# Patient Record
Sex: Female | Born: 1975 | Race: White | Hispanic: No | Marital: Single | Smoking: Current every day smoker
Health system: Southern US, Community
[De-identification: ages and names within clinical notes are randomized; demographics above are authoritative.]

## PROBLEM LIST (undated history)

## (undated) DIAGNOSIS — F17200 Nicotine dependence, unspecified, uncomplicated: Secondary | ICD-10-CM

## (undated) DIAGNOSIS — J45909 Unspecified asthma, uncomplicated: Secondary | ICD-10-CM

## (undated) DIAGNOSIS — F1290 Cannabis use, unspecified, uncomplicated: Secondary | ICD-10-CM

## (undated) DIAGNOSIS — B009 Herpesviral infection, unspecified: Secondary | ICD-10-CM

## (undated) DIAGNOSIS — O09519 Supervision of elderly primigravida, unspecified trimester: Secondary | ICD-10-CM

## (undated) HISTORY — DX: Nicotine dependence, unspecified, uncomplicated: F17.200

## (undated) HISTORY — DX: Unspecified asthma, uncomplicated: J45.909

## (undated) HISTORY — DX: Supervision of elderly primigravida, unspecified trimester: O09.519

## (undated) HISTORY — PX: OTHER SURGICAL HISTORY: SHX169

---

## 2014-02-01 ENCOUNTER — Other Ambulatory Visit (INDEPENDENT_AMBULATORY_CARE_PROVIDER_SITE_OTHER): Payer: Medicaid Other

## 2014-02-01 ENCOUNTER — Other Ambulatory Visit: Payer: Self-pay | Admitting: Obstetrics and Gynecology

## 2014-02-01 DIAGNOSIS — O3680X0 Pregnancy with inconclusive fetal viability, not applicable or unspecified: Secondary | ICD-10-CM

## 2014-02-01 DIAGNOSIS — O09291 Supervision of pregnancy with other poor reproductive or obstetric history, first trimester: Secondary | ICD-10-CM

## 2014-02-01 DIAGNOSIS — O09521 Supervision of elderly multigravida, first trimester: Secondary | ICD-10-CM

## 2014-02-01 NOTE — Progress Notes (Signed)
U/S-single IUP with +FCA noted, FHR- 118 bpm, CRL c/w 6+3wks EDD 09/24/2014, cx appears closed, bilateral adnexa appears WNL

## 2014-02-09 ENCOUNTER — Other Ambulatory Visit (HOSPITAL_COMMUNITY)
Admission: RE | Admit: 2014-02-09 | Discharge: 2014-02-09 | Disposition: A | Discharge status: Home / Self Care | Payer: Medicaid Other | Source: Ambulatory Visit | Attending: Adult Health | Admitting: Adult Health

## 2014-02-09 ENCOUNTER — Encounter: Payer: Self-pay | Admitting: Adult Health

## 2014-02-09 ENCOUNTER — Ambulatory Visit (INDEPENDENT_AMBULATORY_CARE_PROVIDER_SITE_OTHER): Payer: Medicaid Other | Admitting: Adult Health

## 2014-02-09 VITALS — BP 108/60 | Ht 65.0 in | Wt 137.0 lb

## 2014-02-09 DIAGNOSIS — Z124 Encounter for screening for malignant neoplasm of cervix: Secondary | ICD-10-CM

## 2014-02-09 DIAGNOSIS — Z1151 Encounter for screening for human papillomavirus (HPV): Secondary | ICD-10-CM | POA: Insufficient documentation

## 2014-02-09 DIAGNOSIS — Z23 Encounter for immunization: Secondary | ICD-10-CM

## 2014-02-09 DIAGNOSIS — Z114 Encounter for screening for human immunodeficiency virus [HIV]: Secondary | ICD-10-CM

## 2014-02-09 DIAGNOSIS — Z0283 Encounter for blood-alcohol and blood-drug test: Secondary | ICD-10-CM

## 2014-02-09 DIAGNOSIS — J45909 Unspecified asthma, uncomplicated: Secondary | ICD-10-CM | POA: Insufficient documentation

## 2014-02-09 DIAGNOSIS — F17200 Nicotine dependence, unspecified, uncomplicated: Secondary | ICD-10-CM

## 2014-02-09 DIAGNOSIS — Z0184 Encounter for antibody response examination: Secondary | ICD-10-CM

## 2014-02-09 DIAGNOSIS — Z3481 Encounter for supervision of other normal pregnancy, first trimester: Secondary | ICD-10-CM

## 2014-02-09 DIAGNOSIS — O09519 Supervision of elderly primigravida, unspecified trimester: Secondary | ICD-10-CM

## 2014-02-09 DIAGNOSIS — Z1329 Encounter for screening for other suspected endocrine disorder: Secondary | ICD-10-CM

## 2014-02-09 DIAGNOSIS — O09511 Supervision of elderly primigravida, first trimester: Secondary | ICD-10-CM

## 2014-02-09 DIAGNOSIS — Z01419 Encounter for gynecological examination (general) (routine) without abnormal findings: Secondary | ICD-10-CM | POA: Diagnosis present

## 2014-02-09 DIAGNOSIS — Z3491 Encounter for supervision of normal pregnancy, unspecified, first trimester: Secondary | ICD-10-CM

## 2014-02-09 DIAGNOSIS — IMO0001 Reserved for inherently not codable concepts without codable children: Secondary | ICD-10-CM

## 2014-02-09 DIAGNOSIS — Z331 Pregnant state, incidental: Secondary | ICD-10-CM

## 2014-02-09 DIAGNOSIS — Z113 Encounter for screening for infections with a predominantly sexual mode of transmission: Secondary | ICD-10-CM | POA: Insufficient documentation

## 2014-02-09 DIAGNOSIS — Z3490 Encounter for supervision of normal pregnancy, unspecified, unspecified trimester: Secondary | ICD-10-CM | POA: Insufficient documentation

## 2014-02-09 DIAGNOSIS — Z1389 Encounter for screening for other disorder: Secondary | ICD-10-CM

## 2014-02-09 HISTORY — DX: Reserved for inherently not codable concepts without codable children: IMO0001

## 2014-02-09 HISTORY — DX: Supervision of elderly primigravida, unspecified trimester: O09.519

## 2014-02-09 HISTORY — DX: Nicotine dependence, unspecified, uncomplicated: F17.200

## 2014-02-09 HISTORY — DX: Unspecified asthma, uncomplicated: J45.909

## 2014-02-09 LAB — POCT URINALYSIS DIPSTICK
Blood, UA: NEGATIVE
Glucose, UA: NEGATIVE
Ketones, UA: NEGATIVE
Nitrite, UA: NEGATIVE
PROTEIN UA: NEGATIVE

## 2014-02-09 LAB — CBC
HEMATOCRIT: 41.5 % (ref 36.0–46.0)
Hemoglobin: 14.2 g/dL (ref 12.0–15.0)
MCH: 30.3 pg (ref 26.0–34.0)
MCHC: 34.2 g/dL (ref 30.0–36.0)
MCV: 88.7 fL (ref 78.0–100.0)
PLATELETS: 177 10*3/uL (ref 150–400)
RBC: 4.68 MIL/uL (ref 3.87–5.11)
RDW: 13.7 % (ref 11.5–15.5)
WBC: 5.4 10*3/uL (ref 4.0–10.5)

## 2014-02-09 LAB — TSH: TSH: 1.177 u[IU]/mL (ref 0.350–4.500)

## 2014-02-09 MED ORDER — PRENATAL PLUS 27-1 MG PO TABS: 1.0000 | ORAL_TABLET | Freq: Every day | ORAL | Status: AC

## 2014-02-09 NOTE — Patient Instructions (Signed)
Smoking Cessation Quitting smoking is important to your health and has many advantages. However, it is not always easy to quit since nicotine is a very addictive drug. Oftentimes, people try 3 times or more before being able to quit. This document explains the best ways for you to prepare to quit smoking. Quitting takes hard work and a lot of effort, but you can do it. ADVANTAGES OF QUITTING SMOKING  You will live longer, feel better, and live better.  Your body will feel the impact of quitting smoking almost immediately.  Within 20 minutes, blood pressure decreases. Your pulse returns to its normal level.  After 8 hours, carbon monoxide levels in the blood return to normal. Your oxygen level increases.  After 24 hours, the chance of having a heart attack starts to decrease. Your breath, hair, and body stop smelling like smoke.  After 48 hours, damaged nerve endings begin to recover. Your sense of taste and smell improve.  After 72 hours, the body is virtually free of nicotine. Your bronchial tubes relax and breathing becomes easier.  After 2 to 12 weeks, lungs can hold more air. Exercise becomes easier and circulation improves.  The risk of having a heart attack, stroke, cancer, or lung disease is greatly reduced.  After 1 year, the risk of coronary heart disease is cut in half.  After 5 years, the risk of stroke falls to the same as a nonsmoker.  After 10 years, the risk of lung cancer is cut in half and the risk of other cancers decreases significantly.  After 15 years, the risk of coronary heart disease drops, usually to the level of a nonsmoker.  If you are pregnant, quitting smoking will improve your chances of having a healthy baby.  The people you live with, especially any children, will be healthier.  You will have extra money to spend on things other than cigarettes. QUESTIONS TO THINK ABOUT BEFORE ATTEMPTING TO QUIT You may want to talk about your answers with your  health care provider.  Why do you want to quit?  If you tried to quit in the past, what helped and what did not?  What will be the most difficult situations for you after you quit? How will you plan to handle them?  Who can help you through the tough times? Your family? Friends? A health care provider?  What pleasures do you get from smoking? What ways can you still get pleasure if you quit? Here are some questions to ask your health care provider:  How can you help me to be successful at quitting?  What medicine do you think would be best for me and how should I take it?  What should I do if I need more help?  What is smoking withdrawal like? How can I get information on withdrawal? GET READY  Set a quit date.  Change your environment by getting rid of all cigarettes, ashtrays, matches, and lighters in your home, car, or work. Do not let people smoke in your home.  Review your past attempts to quit. Think about what worked and what did not. GET SUPPORT AND ENCOURAGEMENT You have a better chance of being successful if you have help. You can get support in many ways.  Tell your family, friends, and coworkers that you are going to quit and need their support. Ask them not to smoke around you.  Get individual, group, or telephone counseling and support. Programs are available at local hospitals and health centers. Call   your local health department for information about programs in your area.  Spiritual beliefs and practices may help some smokers quit.  Download a "quit meter" on your computer to keep track of quit statistics, such as how long you have gone without smoking, cigarettes not smoked, and money saved.  Get a self-help book about quitting smoking and staying off tobacco. LEARN NEW SKILLS AND BEHAVIORS  Distract yourself from urges to smoke. Talk to someone, go for a walk, or occupy your time with a task.  Change your normal routine. Take a different route to work.  Drink tea instead of coffee. Eat breakfast in a different place.  Reduce your stress. Take a hot bath, exercise, or read a book.  Plan something enjoyable to do every day. Reward yourself for not smoking.  Explore interactive web-based programs that specialize in helping you quit. GET MEDICINE AND USE IT CORRECTLY Medicines can help you stop smoking and decrease the urge to smoke. Combining medicine with the above behavioral methods and support can greatly increase your chances of successfully quitting smoking.  Nicotine replacement therapy helps deliver nicotine to your body without the negative effects and risks of smoking. Nicotine replacement therapy includes nicotine gum, lozenges, inhalers, nasal sprays, and skin patches. Some may be available over-the-counter and others require a prescription.  Antidepressant medicine helps people abstain from smoking, but how this works is unknown. This medicine is available by prescription.  Nicotinic receptor partial agonist medicine simulates the effect of nicotine in your brain. This medicine is available by prescription. Ask your health care provider for advice about which medicines to use and how to use them based on your health history. Your health care provider will tell you what side effects to look out for if you choose to be on a medicine or therapy. Carefully read the information on the package. Do not use any other product containing nicotine while using a nicotine replacement product.  RELAPSE OR DIFFICULT SITUATIONS Most relapses occur within the first 3 months after quitting. Do not be discouraged if you start smoking again. Remember, most people try several times before finally quitting. You may have symptoms of withdrawal because your body is used to nicotine. You may crave cigarettes, be irritable, feel very hungry, cough often, get headaches, or have difficulty concentrating. The withdrawal symptoms are only temporary. They are strongest  when you first quit, but they will go away within 10-14 days. To reduce the chances of relapse, try to:  Avoid drinking alcohol. Drinking lowers your chances of successfully quitting.  Reduce the amount of caffeine you consume. Once you quit smoking, the amount of caffeine in your body increases and can give you symptoms, such as a rapid heartbeat, sweating, and anxiety.  Avoid smokers because they can make you want to smoke.  Do not let weight gain distract you. Many smokers will gain weight when they quit, usually less than 10 pounds. Eat a healthy diet and stay active. You can always lose the weight gained after you quit.  Find ways to improve your mood other than smoking. FOR MORE INFORMATION  www.smokefree.gov  Document Released: 04/03/2001 Document Revised: 08/24/2013 Document Reviewed: 07/19/2011 [facility] Patient Information 2015 [place], [place]. This information is not intended to replace advice given to you by your health care provider. Make sure you discuss any questions you have with your health care provider. First Trimester of Pregnancy The first trimester of pregnancy is from week 1 until the end of week 12 (months 1 through  3). A week after a sperm fertilizes an egg, the egg will implant on the wall of the uterus. This embryo will begin to develop into a baby. Genes from you and your partner are forming the baby. The female genes determine whether the baby is a boy or a girl. At 6-8 weeks, the eyes and face are formed, and the heartbeat can be seen on ultrasound. At the end of 12 weeks, all the baby's organs are formed.  Now that you are pregnant, you will want to do everything you can to have a healthy baby. Two of the most important things are to get good prenatal care and to follow your health care provider's instructions. Prenatal care is all the medical care you receive before the baby's birth. This care will help prevent, find, and treat any problems during the pregnancy and  childbirth. BODY CHANGES Your body goes through many changes during pregnancy. The changes vary from woman to woman.   You may gain or lose a couple of pounds at first.  You may feel sick to your stomach (nauseous) and throw up (vomit). If the vomiting is uncontrollable, call your health care provider.  You may tire easily.  You may develop headaches that can be relieved by medicines approved by your health care provider.  You may urinate more often. Painful urination may mean you have a bladder infection.  You may develop heartburn as a result of your pregnancy.  You may develop constipation because certain hormones are causing the muscles that push waste through your intestines to slow down.  You may develop hemorrhoids or swollen, bulging veins (varicose veins).  Your breasts may begin to grow larger and become tender. Your nipples may stick out more, and the tissue that surrounds them (areola) may become darker.  Your gums may bleed and may be sensitive to brushing and flossing.  Dark spots or blotches (chloasma, mask of pregnancy) may develop on your face. This will likely fade after the baby is born.  Your menstrual periods will stop.  You may have a loss of appetite.  You may develop cravings for certain kinds of food.  You may have changes in your emotions from day to day, such as being excited to be pregnant or being concerned that something may go wrong with the pregnancy and baby.  You may have more vivid and strange dreams.  You may have changes in your hair. These can include thickening of your hair, rapid growth, and changes in texture. Some women also have hair loss during or after pregnancy, or hair that feels dry or thin. Your hair will most likely return to normal after your baby is born. WHAT TO EXPECT AT YOUR PRENATAL VISITS During a routine prenatal visit:  You will be weighed to make sure you and the baby are growing normally.  Your blood pressure will  be taken.  Your abdomen will be measured to track your baby's growth.  The fetal heartbeat will be listened to starting around week 10 or 12 of your pregnancy.  Test results from any previous visits will be discussed. Your health care provider may ask you:  How you are feeling.  If you are feeling the baby move.  If you have had any abnormal symptoms, such as leaking fluid, bleeding, severe headaches, or abdominal cramping.  If you have any questions. Other tests that may be performed during your first trimester include:  Blood tests to find your blood type and to check for  the presence of any previous infections. They will also be used to check for low iron levels (anemia) and Rh antibodies. Later in the pregnancy, blood tests for diabetes will be done along with other tests if problems develop.  Urine tests to check for infections, diabetes, or protein in the urine.  An ultrasound to confirm the proper growth and development of the baby.  An amniocentesis to check for possible genetic problems.  Fetal screens for spina bifida and Down syndrome.  You may need other tests to make sure you and the baby are doing well. HOME CARE INSTRUCTIONS  Medicines  Follow your health care provider's instructions regarding medicine use. Specific medicines may be either safe or unsafe to take during pregnancy.  Take your prenatal vitamins as directed.  If you develop constipation, try taking a stool softener if your health care provider approves. Diet  Eat regular, well-balanced meals. Choose a variety of foods, such as meat or vegetable-based protein, fish, milk and low-fat dairy products, vegetables, fruits, and whole grain breads and cereals. Your health care provider will help you determine the amount of weight gain that is right for you.  Avoid raw meat and uncooked cheese. These carry germs that can cause birth defects in the baby.  Eating four or five small meals rather than three  large meals a day may help relieve nausea and vomiting. If you start to feel nauseous, eating a few soda crackers can be helpful. Drinking liquids between meals instead of during meals also seems to help nausea and vomiting.  If you develop constipation, eat more high-fiber foods, such as fresh vegetables or fruit and whole grains. Drink enough fluids to keep your urine clear or pale yellow. Activity and Exercise  Exercise only as directed by your health care provider. Exercising will help you:  Control your weight.  Stay in shape.  Be prepared for labor and delivery.  Experiencing pain or cramping in the lower abdomen or low back is a good sign that you should stop exercising. Check with your health care provider before continuing normal exercises.  Try to avoid standing for long periods of time. Move your legs often if you must stand in one place for a long time.  Avoid heavy lifting.  Wear low-heeled shoes, and practice good posture.  You may continue to have sex unless your health care provider directs you otherwise. Relief of Pain or Discomfort  Wear a good support bra for breast tenderness.   Take warm sitz baths to soothe any pain or discomfort caused by hemorrhoids. Use hemorrhoid cream if your health care provider approves.   Rest with your legs elevated if you have leg cramps or low back pain.  If you develop varicose veins in your legs, wear support hose. Elevate your feet for 15 minutes, 3-4 times a day. Limit salt in your diet. Prenatal Care  Schedule your prenatal visits by the twelfth week of pregnancy. They are usually scheduled monthly at first, then more often in the last 2 months before delivery.  Write down your questions. Take them to your prenatal visits.  Keep all your prenatal visits as directed by your health care provider. Safety  Wear your seat belt at all times when driving.  Make a list of emergency phone numbers, including numbers for family,  friends, the hospital, and police and fire departments. General Tips  Ask your health care provider for a referral to a local prenatal education class. Begin classes no later than at  the beginning of month 6 of your pregnancy.  Ask for help if you have counseling or nutritional needs during pregnancy. Your health care provider can offer advice or refer you to specialists for help with various needs.  Do not use hot tubs, steam rooms, or saunas.  Do not douche or use tampons or scented sanitary pads.  Do not cross your legs for long periods of time.  Avoid cat litter boxes and soil used by cats. These carry germs that can cause birth defects in the baby and possibly loss of the fetus by miscarriage or stillbirth.  Avoid all smoking, herbs, alcohol, and medicines not prescribed by your health care provider. Chemicals in these affect the formation and growth of the baby.  Schedule a dentist appointment. At home, brush your teeth with a soft toothbrush and be gentle when you floss. SEEK MEDICAL CARE IF:   You have dizziness.  You have mild pelvic cramps, pelvic pressure, or nagging pain in the abdominal area.  You have persistent nausea, vomiting, or diarrhea.  You have a bad smelling vaginal discharge.  You have pain with urination.  You notice increased swelling in your face, hands, legs, or ankles. SEEK IMMEDIATE MEDICAL CARE IF:   You have a fever.  You are leaking fluid from your vagina.  You have spotting or bleeding from your vagina.  You have severe abdominal cramping or pain.  You have rapid weight gain or loss.  You vomit blood or material that looks like coffee grounds.  You are exposed to [place] measles and have never had them.  You are exposed to fifth disease or chickenpox.  You develop a severe headache.  You have shortness of breath.  You have any kind of trauma, such as from a fall or a car accident. Document Released: 04/03/2001 Document Revised:  08/24/2013 Document Reviewed: 02/17/2013 [facility] Patient Information 2015 [place], [place]. This information is not intended to replace advice given to you by your health care provider. Make sure you discuss any questions you have with your health care provider. Return in 4 weeks for IT/NT and see Selena BattenKim

## 2014-02-09 NOTE — Progress Notes (Signed)
Subjective:  Brittney MilesRebecca Allen is a 38 y.o. [id number] Caucasian female at 886w4d by US being seen today for her first obstetrical visit.  Her obstetrical history is significant for smoker and AMA.  Pregnancy history fully reviewed.Has lost 70 lbs in last year.  Patient reports nausea. Denies vb, cramping, uti s/s, abnormal/malodorous vag d/c, or vulvovaginal itching/irritation.Has history of bluish green discharge from both nipples per pt for several years.Did not do exam today.  BP 108/60  Wt 137 lb (62.143 kg)  LMP 12/04/2013  HISTORY: OB History  Gravida Para Term Preterm AB SAB TAB Ectopic Multiple Living  5 3 0 0 1 1 0 0 0 3     # Outcome Date GA Lbr Len/2nd Weight Sex Delivery Anes PTL Lv  5 CUR           4 PAR 06/14/02    M SVD   Y  3 PAR 11/13/00    F SVD   Y  2 PAR 08/23/98    F SVD   Y  1 SAB              Past Medical History  Diagnosis Date  . Asthma   . AMA (advanced maternal age) primigravida 35+ 02/09/2014   Past Surgical History  Procedure Laterality Date  . Other surgical history      right arm   Family History  Problem Relation Age of Onset  . Diabetes Mother   . Cirrhosis Mother   . Heart disease Mother   . Other Mother     collapsed lung  . Diabetes Father   . Heart disease Father     Exam   System:     General: Well developed & nourished, no acute distress   Skin: Warm & dry, normal coloration and turgor, no rashes   Neurologic: Alert & oriented, normal mood   Cardiovascular: Regular rate & rhythm   Respiratory: Effort & rate normal, LCTAB, acyanotic   Abdomen: Soft, non tender   Extremities: normal strength, tone                            Thyroid normal, has brownish area on tongue feels like burned will watch Pelvic Exam:    Perineum: Normal perineum   Vulva: Normal, no lesions   Vagina:  Normal mucosa, normal discharge   Cervix: Normal, bulbous, appears closed, everted at OS   Uterus: Normal size/shape/contour for GA   Thin prep pap smear  with high risk HPV cotesting and GC/CHL performed FHR: 154 via US   Assessment:   Pregnancy: [id number] Patient Active Problem List   Diagnosis Date Noted  . Supervision of normal pregnancy in first trimester 02/09/2014  . AMA (advanced maternal age) primigravida 35+ 02/09/2014    606w4d [id number] New OB visit     Plan:  Initial labs drawn Continue prenatal vitamins Problem list reviewed and updated Reviewed n/v relief measures and warning s/s to report Reviewed recommended weight gain based on pre-gravid BMI Encouraged well-balanced diet Genetic Screening discussed Integrated Screen: requested Cystic fibrosis screening discussed declined Ultrasound discussed; fetal survey: requested Follow up in 4 weeks for IT/NT and see Selena BattenKim  Got flu shot today Counseled to stop smoking will refer to [facility] [place] [facility] while in hospital  Adline PotterJennifer A. Griffin, NP 02/09/2014 11:47 AM

## 2014-02-10 LAB — RPR

## 2014-02-10 LAB — DRUG SCREEN, URINE, NO CONFIRMATION
Amphetamine Screen, Ur: NEGATIVE
Barbiturate Quant, Ur: NEGATIVE
Benzodiazepines.: NEGATIVE
Cocaine Metabolites: NEGATIVE
Creatinine,U: 52.6 mg/dL
MARIJUANA METABOLITE: POSITIVE — AB
METHADONE: NEGATIVE
OPIATE SCREEN, URINE: NEGATIVE
PHENCYCLIDINE (PCP): NEGATIVE
Propoxyphene: NEGATIVE

## 2014-02-10 LAB — URINALYSIS
Bilirubin Urine: NEGATIVE
Glucose, UA: NEGATIVE mg/dL
HGB URINE DIPSTICK: NEGATIVE
Ketones, ur: NEGATIVE mg/dL
NITRITE: NEGATIVE
PH: 7.5 (ref 5.0–8.0)
PROTEIN: NEGATIVE mg/dL
Specific Gravity, Urine: 1.011 (ref 1.005–1.030)
Urobilinogen, UA: 0.2 mg/dL (ref 0.0–1.0)

## 2014-02-10 LAB — OXYCODONE SCREEN, UA, RFLX CONFIRM: Oxycodone Screen, Ur: NEGATIVE ng/mL

## 2014-02-10 LAB — HEPATITIS B SURFACE ANTIGEN: Hepatitis B Surface Ag: NEGATIVE

## 2014-02-10 LAB — ABO AND RH: Rh Type: POSITIVE

## 2014-02-10 LAB — HIV ANTIBODY (ROUTINE TESTING W REFLEX): HIV: NONREACTIVE

## 2014-02-10 LAB — CYTOLOGY - PAP

## 2014-02-10 LAB — VARICELLA ZOSTER ANTIBODY, IGG: Varicella IgG: 651.1 Index — ABNORMAL HIGH (ref <135.00)

## 2014-02-10 LAB — RUBELLA SCREEN: RUBELLA: 6.64 {index} — AB (ref <0.90)

## 2014-02-10 LAB — ANTIBODY SCREEN: Antibody Screen: NEGATIVE

## 2014-02-11 LAB — URINE CULTURE
COLONY COUNT: NO GROWTH
Organism ID, Bacteria: NO GROWTH

## 2014-02-23 ENCOUNTER — Encounter: Payer: Self-pay | Admitting: Adult Health

## 2014-03-02 ENCOUNTER — Telehealth: Payer: Self-pay | Admitting: *Deleted

## 2014-03-02 NOTE — Telephone Encounter (Signed)
Pt states she is [redacted] wks pregnant and has terrible cough and chest congestion, our records have her at 10+4.  I advised her to try saline nasal spray, Vicks Vapo Rub, steam therapy, push fluids and hot decaffeinated tea for the congestion and cough drops for the cough.  Informed her that she could take Robitussin (alcohol free) if really needs it and if no improvement or if symptoms worsen to call back and we will get her in to be seen.  Pt verbalized understanding.

## 2014-03-09 ENCOUNTER — Ambulatory Visit (INDEPENDENT_AMBULATORY_CARE_PROVIDER_SITE_OTHER): Payer: Medicaid Other | Admitting: Women's Health

## 2014-03-09 ENCOUNTER — Ambulatory Visit (INDEPENDENT_AMBULATORY_CARE_PROVIDER_SITE_OTHER): Payer: Medicaid Other

## 2014-03-09 ENCOUNTER — Encounter: Payer: Self-pay | Admitting: Women's Health

## 2014-03-09 ENCOUNTER — Other Ambulatory Visit: Payer: Self-pay | Admitting: Adult Health

## 2014-03-09 VITALS — BP 108/62 | Wt 142.5 lb

## 2014-03-09 DIAGNOSIS — F17200 Nicotine dependence, unspecified, uncomplicated: Secondary | ICD-10-CM

## 2014-03-09 DIAGNOSIS — F1910 Other psychoactive substance abuse, uncomplicated: Secondary | ICD-10-CM

## 2014-03-09 DIAGNOSIS — Z720 Tobacco use: Secondary | ICD-10-CM

## 2014-03-09 DIAGNOSIS — O09521 Supervision of elderly multigravida, first trimester: Secondary | ICD-10-CM

## 2014-03-09 DIAGNOSIS — Z3682 Encounter for antenatal screening for nuchal translucency: Secondary | ICD-10-CM

## 2014-03-09 DIAGNOSIS — Z36 Encounter for antenatal screening of mother: Secondary | ICD-10-CM

## 2014-03-09 DIAGNOSIS — O09511 Supervision of elderly primigravida, first trimester: Secondary | ICD-10-CM

## 2014-03-09 DIAGNOSIS — Z3481 Encounter for supervision of other normal pregnancy, first trimester: Secondary | ICD-10-CM

## 2014-03-09 DIAGNOSIS — O99321 Drug use complicating pregnancy, first trimester: Secondary | ICD-10-CM

## 2014-03-09 DIAGNOSIS — Z1389 Encounter for screening for other disorder: Secondary | ICD-10-CM

## 2014-03-09 DIAGNOSIS — Z331 Pregnant state, incidental: Secondary | ICD-10-CM

## 2014-03-09 LAB — POCT URINALYSIS DIPSTICK
Blood, UA: NEGATIVE
Glucose, UA: NEGATIVE
Ketones, UA: NEGATIVE
Leukocytes, UA: NEGATIVE
NITRITE UA: NEGATIVE
PROTEIN UA: NEGATIVE

## 2014-03-09 NOTE — Progress Notes (Signed)
U/S(11+4wks)-single active fetus, meas c/w dates, fluid WNL, anterior Gr 0 placenta, cx appears closed, bilateral adnexa appears WNL, NB present, NT-1.6736mm, FHR- 151 bpm

## 2014-03-09 NOTE — Patient Instructions (Signed)
Constipation  Drink plenty of fluid, preferably water, throughout the day  Eat foods high in fiber such as fruits, vegetables, and grains  Exercise, such as walking, is a good way to keep your bowels regular  Drink warm fluids, especially warm prune juice, or decaf coffee  Eat a 1/2 cup of real oatmeal (not instant), 1/2 cup applesauce, and 1/2-1 cup warm prune juice every day  If needed, you may take Colace (docusate sodium) stool softener once or twice a day to help keep the stool soft. If you are pregnant, wait until you are out of your first trimester (12-14 weeks of pregnancy)  If you still are having problems with constipation, you may take Miralax once daily as needed to help keep your bowels regular.  If you are pregnant, wait until you are out of your first trimester (12-14 weeks of pregnancy)   First Trimester of Pregnancy The first trimester of pregnancy is from week 1 until the end of week 12 (months 1 through 3). A week after a sperm fertilizes an egg, the egg will implant on the wall of the uterus. This embryo will begin to develop into a baby. Genes from you and your partner are forming the baby. The female genes determine whether the baby is a boy or a girl. At 6-8 weeks, the eyes and face are formed, and the heartbeat can be seen on ultrasound. At the end of 12 weeks, all the baby's organs are formed.  Now that you are pregnant, you will want to do everything you can to have a healthy baby. Two of the most important things are to get good prenatal care and to follow your health care provider's instructions. Prenatal care is all the medical care you receive before the baby's birth. This care will help prevent, find, and treat any problems during the pregnancy and childbirth. BODY CHANGES Your body goes through many changes during pregnancy. The changes vary from woman to woman.   You may gain or lose a couple of pounds at first.  You may feel sick to your stomach (nauseous) and  throw up (vomit). If the vomiting is uncontrollable, call your health care provider.  You may tire easily.  You may develop headaches that can be relieved by medicines approved by your health care provider.  You may urinate more often. Painful urination may mean you have a bladder infection.  You may develop heartburn as a result of your pregnancy.  You may develop constipation because certain hormones are causing the muscles that push waste through your intestines to slow down.  You may develop hemorrhoids or swollen, bulging veins (varicose veins).  Your breasts may begin to grow larger and become tender. Your nipples may stick out more, and the tissue that surrounds them (areola) may become darker.  Your gums may bleed and may be sensitive to brushing and flossing.  Dark spots or blotches (chloasma, mask of pregnancy) may develop on your face. This will likely fade after the baby is born.  Your menstrual periods will stop.  You may have a loss of appetite.  You may develop cravings for certain kinds of food.  You may have changes in your emotions from day to day, such as being excited to be pregnant or being concerned that something may go wrong with the pregnancy and baby.  You may have more vivid and strange dreams.  You may have changes in your hair. These can include thickening of your hair, rapid growth, and   changes in texture. Some women also have hair loss during or after pregnancy, or hair that feels dry or thin. Your hair will most likely return to normal after your baby is born. WHAT TO EXPECT AT YOUR PRENATAL VISITS During a routine prenatal visit:  You will be weighed to make sure you and the baby are growing normally.  Your blood pressure will be taken.  Your abdomen will be measured to track your baby's growth.  The fetal heartbeat will be listened to starting around week 10 or 12 of your pregnancy.  Test results from any previous visits will be  discussed. Your health care provider may ask you:  How you are feeling.  If you are feeling the baby move.  If you have had any abnormal symptoms, such as leaking fluid, bleeding, severe headaches, or abdominal cramping.  If you have any questions. Other tests that may be performed during your first trimester include:  Blood tests to find your blood type and to check for the presence of any previous infections. They will also be used to check for low iron levels (anemia) and Rh antibodies. Later in the pregnancy, blood tests for diabetes will be done along with other tests if problems develop.  Urine tests to check for infections, diabetes, or protein in the urine.  An ultrasound to confirm the proper growth and development of the baby.  An amniocentesis to check for possible genetic problems.  Fetal screens for spina bifida and Down syndrome.  You may need other tests to make sure you and the baby are doing well. HOME CARE INSTRUCTIONS  Medicines  Follow your health care provider's instructions regarding medicine use. Specific medicines may be either safe or unsafe to take during pregnancy.  Take your prenatal vitamins as directed.  If you develop constipation, try taking a stool softener if your health care provider approves. Diet  Eat regular, well-balanced meals. Choose a variety of foods, such as meat or vegetable-based protein, fish, milk and low-fat dairy products, vegetables, fruits, and whole grain breads and cereals. Your health care provider will help you determine the amount of weight gain that is right for you.  Avoid raw meat and uncooked cheese. These carry germs that can cause birth defects in the baby.  Eating four or five small meals rather than three large meals a day may help relieve nausea and vomiting. If you start to feel nauseous, eating a few soda crackers can be helpful. Drinking liquids between meals instead of during meals also seems to help nausea and  vomiting.  If you develop constipation, eat more high-fiber foods, such as fresh vegetables or fruit and whole grains. Drink enough fluids to keep your urine clear or pale yellow. Activity and Exercise  Exercise only as directed by your health care provider. Exercising will help you:  Control your weight.  Stay in shape.  Be prepared for labor and delivery.  Experiencing pain or cramping in the lower abdomen or low back is a good sign that you should stop exercising. Check with your health care provider before continuing normal exercises.  Try to avoid standing for long periods of time. Move your legs often if you must stand in one place for a long time.  Avoid heavy lifting.  Wear low-heeled shoes, and practice good posture.  You may continue to have sex unless your health care provider directs you otherwise. Relief of Pain or Discomfort  Wear a good support bra for breast tenderness.   Take   warm sitz baths to soothe any pain or discomfort caused by hemorrhoids. Use hemorrhoid cream if your health care provider approves.   Rest with your legs elevated if you have leg cramps or low back pain.  If you develop varicose veins in your legs, wear support hose. Elevate your feet for 15 minutes, 3-4 times a day. Limit salt in your diet. Prenatal Care  Schedule your prenatal visits by the twelfth week of pregnancy. They are usually scheduled monthly at first, then more often in the last 2 months before delivery.  Write down your questions. Take them to your prenatal visits.  Keep all your prenatal visits as directed by your health care provider. Safety  Wear your seat belt at all times when driving.  Make a list of emergency phone numbers, including numbers for family, friends, the hospital, and police and fire departments. General Tips  Ask your health care provider for a referral to a local prenatal education class. Begin classes no later than at the beginning of month 6 of  your pregnancy.  Ask for help if you have counseling or nutritional needs during pregnancy. Your health care provider can offer advice or refer you to specialists for help with various needs.  Do not use hot tubs, steam rooms, or saunas.  Do not douche or use tampons or scented sanitary pads.  Do not cross your legs for long periods of time.  Avoid cat litter boxes and soil used by cats. These carry germs that can cause birth defects in the baby and possibly loss of the fetus by miscarriage or stillbirth.  Avoid all smoking, herbs, alcohol, and medicines not prescribed by your health care provider. Chemicals in these affect the formation and growth of the baby.  Schedule a dentist appointment. At home, brush your teeth with a soft toothbrush and be gentle when you floss. SEEK MEDICAL CARE IF:   You have dizziness.  You have mild pelvic cramps, pelvic pressure, or nagging pain in the abdominal area.  You have persistent nausea, vomiting, or diarrhea.  You have a bad smelling vaginal discharge.  You have pain with urination.  You notice increased swelling in your face, hands, legs, or ankles. SEEK IMMEDIATE MEDICAL CARE IF:   You have a fever.  You are leaking fluid from your vagina.  You have spotting or bleeding from your vagina.  You have severe abdominal cramping or pain.  You have rapid weight gain or loss.  You vomit blood or material that looks like coffee grounds.  You are exposed to German measles and have never had them.  You are exposed to fifth disease or chickenpox.  You develop a severe headache.  You have shortness of breath.  You have any kind of trauma, such as from a fall or a car accident. Document Released: 04/03/2001 Document Revised: 08/24/2013 Document Reviewed: 02/17/2013 Exit[employer] Patient Information 2015 [employer]. This information is not intended to replace advice given to you by your health care provider. Make sure you discuss any  questions you have with your health care provider.  

## 2014-03-09 NOTE — Progress Notes (Signed)
Low-risk OB appointment [id number] Estimated Date of Delivery: 09/24/14 BP 108/62 mmHg  Wt 142 lb 8 oz (64.638 kg)  LMP 12/04/2013  BP, weight, and urine reviewed.  Refer to obstetrical flow sheet for FH & FHR.  No fm yet. Denies cramping, lof, vb, or uti s/s. Constipation- reviewed prevention/relief measures. Smokes 1ppd/day down from 2ppd, advised cessation, discussed risks to fetus while pregnant, to infant pp, and to herself. Offered [facility], accepted, referral sent.    Reviewed today's normal nt u/s, warning s/s to report. Plan:  Continue routine obstetrical care  F/U in 4wks for OB appointment and 2nd IT 1st IT/NT today

## 2014-03-13 LAB — MATERNAL SCREEN, INTEGRATED #1

## 2014-04-06 ENCOUNTER — Encounter: Payer: Self-pay | Admitting: Women's Health

## 2014-04-06 ENCOUNTER — Ambulatory Visit (INDEPENDENT_AMBULATORY_CARE_PROVIDER_SITE_OTHER): Payer: Medicaid Other | Admitting: Women's Health

## 2014-04-06 VITALS — BP 108/58 | Wt 142.0 lb

## 2014-04-06 DIAGNOSIS — Z363 Encounter for antenatal screening for malformations: Secondary | ICD-10-CM

## 2014-04-06 DIAGNOSIS — Z3682 Encounter for antenatal screening for nuchal translucency: Secondary | ICD-10-CM

## 2014-04-06 DIAGNOSIS — Z3492 Encounter for supervision of normal pregnancy, unspecified, second trimester: Secondary | ICD-10-CM

## 2014-04-06 DIAGNOSIS — Z3491 Encounter for supervision of normal pregnancy, unspecified, first trimester: Secondary | ICD-10-CM

## 2014-04-06 DIAGNOSIS — Z1389 Encounter for screening for other disorder: Secondary | ICD-10-CM

## 2014-04-06 DIAGNOSIS — Z331 Pregnant state, incidental: Secondary | ICD-10-CM

## 2014-04-06 LAB — POCT URINALYSIS DIPSTICK
Blood, UA: NEGATIVE
Glucose, UA: NEGATIVE
Ketones, UA: NEGATIVE
Leukocytes, UA: NEGATIVE
Nitrite, UA: NEGATIVE
Protein, UA: NEGATIVE

## 2014-04-06 NOTE — Progress Notes (Signed)
Low-risk OB appointment [id number] Estimated Date of Delivery: 09/24/14 BP 108/58 mmHg  Wt 142 lb (64.411 kg)  LMP 12/04/2013  BP, weight, and urine reviewed.  Refer to obstetrical flow sheet for FH & FHR.  Starting to feel fm.  Denies regular uc's, lof, vb, or uti s/s. No complaints. Reviewed warning s/s to report. Plan:  Continue routine obstetrical care  F/U in 4wks for OB appointment and anatomy u/s 2nd IT today

## 2014-04-06 NOTE — Patient Instructions (Signed)
Second Trimester of Pregnancy The second trimester is from week 13 through week 28, months 4 through 6. The second trimester is often a time when you feel your best. Your body has also adjusted to being pregnant, and you begin to feel better physically. Usually, morning sickness has lessened or quit completely, you may have more energy, and you may have an increase in appetite. The second trimester is also a time when the fetus is growing rapidly. At the end of the sixth month, the fetus is about 9 inches long and weighs about 1 pounds. You will likely begin to feel the baby move (quickening) between 18 and 20 weeks of the pregnancy. BODY CHANGES Your body goes through many changes during pregnancy. The changes vary from woman to woman.   Your weight will continue to increase. You will notice your lower abdomen bulging out.  You may begin to get stretch marks on your hips, abdomen, and breasts.  You may develop headaches that can be relieved by medicines approved by your health care provider.  You may urinate more often because the fetus is pressing on your bladder.  You may develop or continue to have heartburn as a result of your pregnancy.  You may develop constipation because certain hormones are causing the muscles that push waste through your intestines to slow down.  You may develop hemorrhoids or swollen, bulging veins (varicose veins).  You may have back pain because of the weight gain and pregnancy hormones relaxing your joints between the bones in your pelvis and as a result of a shift in weight and the muscles that support your balance.  Your breasts will continue to grow and be tender.  Your gums may bleed and may be sensitive to brushing and flossing.  Dark spots or blotches (chloasma, mask of pregnancy) may develop on your face. This will likely fade after the baby is born.  A dark line from your belly button to the pubic area (linea nigra) may appear. This will likely fade  after the baby is born.  You may have changes in your hair. These can include thickening of your hair, rapid growth, and changes in texture. Some women also have hair loss during or after pregnancy, or hair that feels dry or thin. Your hair will most likely return to normal after your baby is born. WHAT TO EXPECT AT YOUR PRENATAL VISITS During a routine prenatal visit:  You will be weighed to make sure you and the fetus are growing normally.  Your blood pressure will be taken.  Your abdomen will be measured to track your baby's growth.  The fetal heartbeat will be listened to.  Any test results from the previous visit will be discussed. Your health care provider may ask you:  How you are feeling.  If you are feeling the baby move.  If you have had any abnormal symptoms, such as leaking fluid, bleeding, severe headaches, or abdominal cramping.  If you have any questions. Other tests that may be performed during your second trimester include:  Blood tests that check for:  Low iron levels (anemia).  Gestational diabetes (between 24 and 28 weeks).  Rh antibodies.  Urine tests to check for infections, diabetes, or protein in the urine.  An ultrasound to confirm the proper growth and development of the baby.  An amniocentesis to check for possible genetic problems.  Fetal screens for spina bifida and Down syndrome. HOME CARE INSTRUCTIONS   Avoid all smoking, herbs, alcohol, and unprescribed   drugs. These chemicals affect the formation and growth of the baby.  Follow your health care provider's instructions regarding medicine use. There are medicines that are either safe or unsafe to take during pregnancy.  Exercise only as directed by your health care provider. Experiencing uterine cramps is a good sign to stop exercising.  Continue to eat regular, healthy meals.  Wear a good support bra for breast tenderness.  Do not use hot tubs, steam rooms, or saunas.  Wear your  seat belt at all times when driving.  Avoid raw meat, uncooked cheese, cat litter boxes, and soil used by cats. These carry germs that can cause birth defects in the baby.  Take your prenatal vitamins.  Try taking a stool softener (if your health care provider approves) if you develop constipation. Eat more high-fiber foods, such as fresh vegetables or fruit and whole grains. Drink plenty of fluids to keep your urine clear or pale yellow.  Take warm sitz baths to soothe any pain or discomfort caused by hemorrhoids. Use hemorrhoid cream if your health care provider approves.  If you develop varicose veins, wear support hose. Elevate your feet for 15 minutes, 3-4 times a day. Limit salt in your diet.  Avoid heavy lifting, wear low heel shoes, and practice good posture.  Rest with your legs elevated if you have leg cramps or low back pain.  Visit your dentist if you have not gone yet during your pregnancy. Use a soft toothbrush to brush your teeth and be gentle when you floss.  A sexual relationship may be continued unless your health care provider directs you otherwise.  Continue to go to all your prenatal visits as directed by your health care provider. SEEK MEDICAL CARE IF:   You have dizziness.  You have mild pelvic cramps, pelvic pressure, or nagging pain in the abdominal area.  You have persistent nausea, vomiting, or diarrhea.  You have a bad smelling vaginal discharge.  You have pain with urination. SEEK IMMEDIATE MEDICAL CARE IF:   You have a fever.  You are leaking fluid from your vagina.  You have spotting or bleeding from your vagina.  You have severe abdominal cramping or pain.  You have rapid weight gain or loss.  You have shortness of breath with chest pain.  You notice sudden or extreme swelling of your face, hands, ankles, feet, or legs.  You have not felt your baby move in over an hour.  You have severe headaches that do not go away with  medicine.  You have vision changes. Document Released: 04/03/2001 Document Revised: 04/14/2013 Document Reviewed: 06/10/2012 [employer] Patient Information 2015 [employer]. This information is not intended to replace advice given to you by your health care provider. Make sure you discuss any questions you have with your health care provider.  

## 2014-04-10 LAB — MATERNAL SCREEN, INTEGRATED #2
AFP MOM MAT SCREEN: 0.97
AFP, SERUM MAT SCREEN: 32 ng/mL
Age risk Down Syndrome: 1:100 {titer}
Calculated Gestational Age: 15.9
Crown Rump Length: 54 mm
Estriol Mom: 0.46
Estriol, Free: 0.37 ng/mL
Inhibin A Dimeric: 281 pg/mL
Inhibin A MoM: 1.59
MSS Down Syndrome: 1:12 {titer}
NT MOM MAT SCREEN: 1.05
NUCHAL TRANSLUCENCY MAT SCREEN 2: 1.36 mm
NUMBER OF FETUSES MAT SCREEN 2: 1
PAPP-A MAT SCREEN: 153 ng/mL
PAPP-A MOM MAT SCREEN: 0.26
Rish for ONTD: <1:5000 {titer}
hCG MoM: 1.3
hCG, Serum: 48.5 IU/mL

## 2014-04-20 ENCOUNTER — Telehealth: Payer: Self-pay | Admitting: Women's Health

## 2014-04-20 ENCOUNTER — Telehealth: Payer: Self-pay | Admitting: *Deleted

## 2014-04-20 DIAGNOSIS — Z3491 Encounter for supervision of normal pregnancy, unspecified, first trimester: Secondary | ICD-10-CM

## 2014-04-20 DIAGNOSIS — O285 Abnormal chromosomal and genetic finding on antenatal screening of mother: Secondary | ICD-10-CM | POA: Insufficient documentation

## 2014-04-20 NOTE — Telephone Encounter (Signed)
LM for pt to return call. Need to notify of abnormal IT screening and needs Qnatal scheduled for further testing for Down Syndrome.  Cheral MarkerKimberly R. Booker, CNM, [facility]WHNP-BC 04/20/2014 10:11 AM

## 2014-04-20 NOTE — Telephone Encounter (Signed)
Notified pt of abnormal IT/increased r/f DS. Does not have transportation to do Qnatal/cfDNA testing until her appt on 1/12. Answered questions.  Cheral MarkerKimberly R. Booker, CNM, WHNP-BC 04/20/2014 12:00 PM

## 2014-04-23 NOTE — L&D Delivery Note (Signed)
Delivery Note At 4:20 AM a viable female was delivered via Vaginal, Spontaneous Delivery (Presentation: Right Occiput Anterior).  APGAR: 9, 9; weight pending.   Placenta status: Intact, Spontaneous.  Cord: 3 vessels with the following complications: None.  Cord pH: n/a  Anesthesia: Epidural  Episiotomy: None Lacerations: None Suture Repair: None Est. Blood Loss (mL): 50  Mom to postpartum.  Baby to Couplet care / Skin to Skin.  Brittney Allen, Brittney Allen 09/14/2014, 4:40 AM

## 2014-05-04 ENCOUNTER — Other Ambulatory Visit: Payer: Self-pay | Admitting: Women's Health

## 2014-05-04 ENCOUNTER — Other Ambulatory Visit: Payer: Medicaid Other

## 2014-05-04 ENCOUNTER — Encounter: Payer: Medicaid Other | Admitting: Advanced Practice Midwife

## 2014-05-04 ENCOUNTER — Ambulatory Visit (INDEPENDENT_AMBULATORY_CARE_PROVIDER_SITE_OTHER): Payer: Medicaid Other | Admitting: Advanced Practice Midwife

## 2014-05-04 ENCOUNTER — Ambulatory Visit (INDEPENDENT_AMBULATORY_CARE_PROVIDER_SITE_OTHER): Payer: Medicaid Other

## 2014-05-04 VITALS — BP 132/84 | Wt 147.0 lb

## 2014-05-04 DIAGNOSIS — Z363 Encounter for antenatal screening for malformations: Secondary | ICD-10-CM

## 2014-05-04 DIAGNOSIS — O99320 Drug use complicating pregnancy, unspecified trimester: Secondary | ICD-10-CM

## 2014-05-04 DIAGNOSIS — O09522 Supervision of elderly multigravida, second trimester: Secondary | ICD-10-CM

## 2014-05-04 DIAGNOSIS — Z36 Encounter for antenatal screening of mother: Secondary | ICD-10-CM

## 2014-05-04 DIAGNOSIS — O99332 Smoking (tobacco) complicating pregnancy, second trimester: Secondary | ICD-10-CM

## 2014-05-04 DIAGNOSIS — Z331 Pregnant state, incidental: Secondary | ICD-10-CM

## 2014-05-04 DIAGNOSIS — F1910 Other psychoactive substance abuse, uncomplicated: Secondary | ICD-10-CM

## 2014-05-04 DIAGNOSIS — Z3492 Encounter for supervision of normal pregnancy, unspecified, second trimester: Secondary | ICD-10-CM

## 2014-05-04 DIAGNOSIS — Z1389 Encounter for screening for other disorder: Secondary | ICD-10-CM

## 2014-05-04 DIAGNOSIS — Z3689 Encounter for other specified antenatal screening: Secondary | ICD-10-CM

## 2014-05-04 DIAGNOSIS — O289 Unspecified abnormal findings on antenatal screening of mother: Secondary | ICD-10-CM

## 2014-05-04 DIAGNOSIS — Z3491 Encounter for supervision of normal pregnancy, unspecified, first trimester: Secondary | ICD-10-CM

## 2014-05-04 LAB — POCT URINALYSIS DIPSTICK
Blood, UA: NEGATIVE
GLUCOSE UA: NEGATIVE
Ketones, UA: NEGATIVE
LEUKOCYTES UA: NEGATIVE
NITRITE UA: NEGATIVE
Protein, UA: NEGATIVE

## 2014-05-04 NOTE — Progress Notes (Addendum)
U/S(19+4wks)-active fetus, meas c/w dates, anterior Gr 1 placenta, cx appears closed (3.5cm), bilateral adnexa appears WNL, FHR-151 bpm, female fetus, no major abnl noted, Nb present, NF-WNL, open hand noted, no clubbing of the feet noted,Long Bones measure c/w dates

## 2014-05-04 NOTE — Progress Notes (Signed)
[id number] Estimated Date of Delivery: 09/24/14  Last menstrual period 12/04/2013.   BP weight and urine results all reviewed and noted.  Please refer to the obstetrical flow sheet for the fundal height and fetal heart rate documentation: Had anatomy scan today:  U/S(19+4wks)-active fetus, meas c/w dates, anterior Gr 1 placenta, cx appears closed (3.5cm), bilateral adnexa appears WNL, FHR-151 bpm, female fetus, no major abnl noted, Nb present, NF-WNL, open hand noted, no clubbing of the feet noted, Long Bones measure c/w dates  Patient reports good fetal movement, denies any bleeding and no rupture of membranes symptoms or regular contractions. Patient is without complaints. All questions were answered.  Plan:  Continued routine obstetrical care,   Follow up in 4 weeks for OB appointment,

## 2014-06-01 ENCOUNTER — Ambulatory Visit (INDEPENDENT_AMBULATORY_CARE_PROVIDER_SITE_OTHER): Payer: Medicaid Other | Admitting: Women's Health

## 2014-06-01 VITALS — BP 118/78 | Wt 151.0 lb

## 2014-06-01 DIAGNOSIS — Z3492 Encounter for supervision of normal pregnancy, unspecified, second trimester: Secondary | ICD-10-CM

## 2014-06-01 DIAGNOSIS — Z3491 Encounter for supervision of normal pregnancy, unspecified, first trimester: Secondary | ICD-10-CM

## 2014-06-01 DIAGNOSIS — Z331 Pregnant state, incidental: Secondary | ICD-10-CM

## 2014-06-01 DIAGNOSIS — Z1389 Encounter for screening for other disorder: Secondary | ICD-10-CM

## 2014-06-01 LAB — POCT URINALYSIS DIPSTICK
GLUCOSE UA: NEGATIVE
Ketones, UA: NEGATIVE
Leukocytes, UA: NEGATIVE
Nitrite, UA: NEGATIVE
Protein, UA: NEGATIVE
RBC UA: NEGATIVE

## 2014-06-01 NOTE — Progress Notes (Signed)
Low-risk OB appointment [id number] Estimated Date of Delivery: 09/24/14 BP 118/78 mmHg  Wt 151 lb (68.493 kg)  LMP 12/04/2013  BP, weight, and urine reviewed.  Refer to obstetrical flow sheet for FH & FHR.  Reports good fm.  Denies regular uc's, lof, vb, or uti s/s. No complaints. Reviewed ptl s/s, fm. Plan:  Continue routine obstetrical care  F/U in 4wks for OB appointment and pn2

## 2014-06-01 NOTE — Patient Instructions (Signed)
You will have your sugar test next visit.  Please do not eat or drink anything after midnight the night before you come, not even water.  You will be here for at least two hours.     Call the office [phone]) or go to [facility] if:  You begin to have strong, frequent contractions  Your water breaks.  Sometimes it is a big gush of fluid, sometimes it is just a trickle that keeps getting your panties wet or running down your legs  You have vaginal bleeding.  It is normal to have a small amount of spotting if your cervix was checked.   You don't feel your baby moving like normal.  If you don't, get you something to eat and drink and lay down and focus on feeling your baby move.   If your baby is still not moving like normal, you should call the office or go to [facility].    Circumcision: $507 at hospital, $244 at [facility], has to be paid up front before it is done. If you want the circumcision done at [facility] you can make payments during pregnancy. If you are interested in this, see receptionist at check-out.  If your baby is older than 28 days when you have the circumcision done at [facility], the fee will go up to $325.50.    [place] Pediatricians:  [facility] [phone]            [facility] [phone]                 [facility] [phone] (usually doesn't accept new patients unless you have family there already, you are always welcome to call and ask)             [facility] ([address] [place]) [phone]   [facility] Pediatricians:   [facility]: [phone]  [facility]: [phone]   Second Trimester of Pregnancy The second trimester is from week 13 through week 28, months 4 through 6. The second trimester is often a time when you feel your best. Your body has also adjusted to being pregnant, and you begin to feel better physically. Usually,  morning sickness has lessened or quit completely, you may have more energy, and you may have an increase in appetite. The second trimester is also a time when the fetus is growing rapidly. At the end of the sixth month, the fetus is about 9 inches long and weighs about 1 pounds. You will likely begin to feel the baby move (quickening) between 18 and 20 weeks of the pregnancy. BODY CHANGES Your body goes through many changes during pregnancy. The changes vary from woman to woman.   Your weight will continue to increase. You will notice your lower abdomen bulging out.  You may begin to get stretch marks on your hips, abdomen, and breasts.  You may develop headaches that can be relieved by medicines approved by your health care provider.  You may urinate more often because the fetus is pressing on your bladder.  You may develop or continue to have heartburn as a result of your pregnancy.  You may develop constipation because certain hormones are causing the muscles that push waste through your intestines to slow down.  You may develop hemorrhoids or swollen, bulging veins (varicose veins).  You may have back pain because of the weight gain and pregnancy hormones relaxing your joints between the bones in your  pelvis and as a result of a shift in weight and the muscles that support your balance.  Your breasts will continue to grow and be tender.  Your gums may bleed and may be sensitive to brushing and flossing.  Dark spots or blotches (chloasma, mask of pregnancy) may develop on your face. This will likely fade after the baby is born.  A dark line from your belly button to the pubic area (linea nigra) may appear. This will likely fade after the baby is born.  You may have changes in your hair. These can include thickening of your hair, rapid growth, and changes in texture. Some women also have hair loss during or after pregnancy, or hair that feels dry or thin. Your hair will most likely  return to normal after your baby is born. WHAT TO EXPECT AT YOUR PRENATAL VISITS During a routine prenatal visit:  You will be weighed to make sure you and the fetus are growing normally.  Your blood pressure will be taken.  Your abdomen will be measured to track your baby's growth.  The fetal heartbeat will be listened to.  Any test results from the previous visit will be discussed. Your health care provider may ask you:  How you are feeling.  If you are feeling the baby move.  If you have had any abnormal symptoms, such as leaking fluid, bleeding, severe headaches, or abdominal cramping.  If you have any questions. Other tests that may be performed during your second trimester include:  Blood tests that check for:  Low iron levels (anemia).  Gestational diabetes (between 24 and 28 weeks).  Rh antibodies.  Urine tests to check for infections, diabetes, or protein in the urine.  An ultrasound to confirm the proper growth and development of the baby.  An amniocentesis to check for possible genetic problems.  Fetal screens for spina bifida and Down syndrome. HOME CARE INSTRUCTIONS   Avoid all smoking, herbs, alcohol, and unprescribed drugs. These chemicals affect the formation and growth of the baby.  Follow your health care provider's instructions regarding medicine use. There are medicines that are either safe or unsafe to take during pregnancy.  Exercise only as directed by your health care provider. Experiencing uterine cramps is a good sign to stop exercising.  Continue to eat regular, healthy meals.  Wear a good support bra for breast tenderness.  Do not use hot tubs, steam rooms, or saunas.  Wear your seat belt at all times when driving.  Avoid raw meat, uncooked cheese, cat litter boxes, and soil used by cats. These carry germs that can cause birth defects in the baby.  Take your prenatal vitamins.  Try taking a stool softener (if your health care  provider approves) if you develop constipation. Eat more high-fiber foods, such as fresh vegetables or fruit and whole grains. Drink plenty of fluids to keep your urine clear or pale yellow.  Take warm sitz baths to soothe any pain or discomfort caused by hemorrhoids. Use hemorrhoid cream if your health care provider approves.  If you develop varicose veins, wear support hose. Elevate your feet for 15 minutes, 3-4 times a day. Limit salt in your diet.  Avoid heavy lifting, wear low heel shoes, and practice good posture.  Rest with your legs elevated if you have leg cramps or low back pain.  Visit your dentist if you have not gone yet during your pregnancy. Use a soft toothbrush to brush your teeth and be gentle when you floss.  A sexual relationship may be continued unless your health care provider directs you otherwise.  Continue to go to all your prenatal visits as directed by your health care provider. SEEK MEDICAL CARE IF:   You have dizziness.  You have mild pelvic cramps, pelvic pressure, or nagging pain in the abdominal area.  You have persistent nausea, vomiting, or diarrhea.  You have a bad smelling vaginal discharge.  You have pain with urination. SEEK IMMEDIATE MEDICAL CARE IF:   You have a fever.  You are leaking fluid from your vagina.  You have spotting or bleeding from your vagina.  You have severe abdominal cramping or pain.  You have rapid weight gain or loss.  You have shortness of breath with chest pain.  You notice sudden or extreme swelling of your face, hands, ankles, feet, or legs.  You have not felt your baby move in over an hour.  You have severe headaches that do not go away with medicine.  You have vision changes. Document Released: 04/03/2001 Document Revised: 04/14/2013 Document Reviewed: 06/10/2012 [facility] Patient Information 2015 [place], [place]. This information is not intended to replace advice given to you by your health care  provider. Make sure you discuss any questions you have with your health care provider.

## 2014-06-29 ENCOUNTER — Ambulatory Visit (INDEPENDENT_AMBULATORY_CARE_PROVIDER_SITE_OTHER): Payer: Medicaid Other | Admitting: Women's Health

## 2014-06-29 ENCOUNTER — Other Ambulatory Visit: Payer: Medicaid Other

## 2014-06-29 ENCOUNTER — Encounter: Payer: Self-pay | Admitting: Women's Health

## 2014-06-29 VITALS — BP 102/54 | HR 84 | Wt 159.0 lb

## 2014-06-29 DIAGNOSIS — O09523 Supervision of elderly multigravida, third trimester: Secondary | ICD-10-CM

## 2014-06-29 DIAGNOSIS — Z3491 Encounter for supervision of normal pregnancy, unspecified, first trimester: Secondary | ICD-10-CM

## 2014-06-29 DIAGNOSIS — Z3483 Encounter for supervision of other normal pregnancy, third trimester: Secondary | ICD-10-CM

## 2014-06-29 DIAGNOSIS — Z0184 Encounter for antibody response examination: Secondary | ICD-10-CM

## 2014-06-29 DIAGNOSIS — Z331 Pregnant state, incidental: Secondary | ICD-10-CM

## 2014-06-29 DIAGNOSIS — Z1389 Encounter for screening for other disorder: Secondary | ICD-10-CM

## 2014-06-29 DIAGNOSIS — Z131 Encounter for screening for diabetes mellitus: Secondary | ICD-10-CM

## 2014-06-29 DIAGNOSIS — Z114 Encounter for screening for human immunodeficiency virus [HIV]: Secondary | ICD-10-CM

## 2014-06-29 DIAGNOSIS — Z3492 Encounter for supervision of normal pregnancy, unspecified, second trimester: Secondary | ICD-10-CM

## 2014-06-29 DIAGNOSIS — F1290 Cannabis use, unspecified, uncomplicated: Secondary | ICD-10-CM

## 2014-06-29 DIAGNOSIS — Z113 Encounter for screening for infections with a predominantly sexual mode of transmission: Secondary | ICD-10-CM

## 2014-06-29 LAB — POCT URINALYSIS DIPSTICK
Blood, UA: NEGATIVE
Glucose, UA: NEGATIVE
KETONES UA: NEGATIVE
Leukocytes, UA: NEGATIVE
Nitrite, UA: NEGATIVE
Protein, UA: NEGATIVE

## 2014-06-29 NOTE — Patient Instructions (Signed)

## 2014-06-29 NOTE — Progress Notes (Signed)
Low-risk OB appointment [id number] Estimated Date of Delivery: 09/24/14 BP 102/54 mmHg  Pulse 84  Wt 159 lb (72.122 kg)  LMP 12/04/2013  BP, weight, and urine reviewed.  Refer to obstetrical flow sheet for FH & FHR.  Reports good fm.  Denies regular uc's, lof, vb, or uti s/s. No complaints. Still smoking 1ppd, states she is trying to quit, declines quitline [place]. Discussed risks of smoking w/ pregnancy.  Desires btl, reviewed r/b, questions answered, consent signed today.  Reviewed ptl s/s, fkc. Recommended Tdap at HD/PCP per CDC guidelines.  Plan:  Continue routine obstetrical care  F/U in 4wks for OB appointment and bpp/afi/efw u/s d/t AMA Will recheck uds today for prior + THC

## 2014-06-30 ENCOUNTER — Other Ambulatory Visit: Payer: Self-pay | Admitting: Obstetrics & Gynecology

## 2014-06-30 DIAGNOSIS — Z3491 Encounter for supervision of normal pregnancy, unspecified, first trimester: Secondary | ICD-10-CM

## 2014-06-30 LAB — CBC
HCT: 34.7 % (ref 34.0–46.6)
Hemoglobin: 11.8 g/dL (ref 11.1–15.9)
MCH: 32 pg (ref 26.6–33.0)
MCHC: 34 g/dL (ref 31.5–35.7)
MCV: 94 fL (ref 79–97)
Platelets: 129 10*3/uL — ABNORMAL LOW (ref 150–379)
RBC: 3.69 x10E6/uL — ABNORMAL LOW (ref 3.77–5.28)
RDW: 13.1 % (ref 12.3–15.4)
WBC: 7.8 10*3/uL (ref 3.4–10.8)

## 2014-06-30 LAB — DRUG SCREEN, URINE
Amphetamines, Urine: NEGATIVE ng/mL
BENZODIAZEPINE QUANT UR: NEGATIVE ng/mL
Barbiturate screen, urine: NEGATIVE ng/mL
CANNABINOID QUANT UR: POSITIVE ng/mL
COCAINE (METAB.): NEGATIVE ng/mL
OPIATE QUANT UR: NEGATIVE ng/mL
PCP Quant, Ur: NEGATIVE ng/mL

## 2014-06-30 LAB — RPR: RPR Ser Ql: NONREACTIVE

## 2014-06-30 LAB — GLUCOSE TOLERANCE, 2 HOURS W/ 1HR
GLUCOSE, FASTING: 71 mg/dL (ref 65–91)
Glucose, 1 hour: 124 mg/dL (ref 65–179)
Glucose, 2 hour: 79 mg/dL (ref 65–152)

## 2014-06-30 LAB — HIV ANTIBODY (ROUTINE TESTING W REFLEX): HIV SCREEN 4TH GENERATION: NONREACTIVE

## 2014-06-30 LAB — ANTIBODY SCREEN: ANTIBODY SCREEN: NEGATIVE

## 2014-06-30 LAB — HSV 2 ANTIBODY, IGG: HSV 2 Glycoprotein G Ab, IgG: 7.76 index — ABNORMAL HIGH (ref 0.00–0.90)

## 2014-07-27 ENCOUNTER — Other Ambulatory Visit: Payer: Self-pay | Admitting: Women's Health

## 2014-07-27 ENCOUNTER — Ambulatory Visit (INDEPENDENT_AMBULATORY_CARE_PROVIDER_SITE_OTHER): Payer: Medicaid Other | Admitting: Women's Health

## 2014-07-27 ENCOUNTER — Encounter: Payer: Self-pay | Admitting: Women's Health

## 2014-07-27 ENCOUNTER — Ambulatory Visit (INDEPENDENT_AMBULATORY_CARE_PROVIDER_SITE_OTHER): Payer: Medicaid Other

## 2014-07-27 VITALS — BP 118/62 | HR 80 | Wt 161.0 lb

## 2014-07-27 DIAGNOSIS — Z1389 Encounter for screening for other disorder: Secondary | ICD-10-CM

## 2014-07-27 DIAGNOSIS — Z3491 Encounter for supervision of normal pregnancy, unspecified, first trimester: Secondary | ICD-10-CM

## 2014-07-27 DIAGNOSIS — O285 Abnormal chromosomal and genetic finding on antenatal screening of mother: Secondary | ICD-10-CM

## 2014-07-27 DIAGNOSIS — R768 Other specified abnormal immunological findings in serum: Secondary | ICD-10-CM | POA: Insufficient documentation

## 2014-07-27 DIAGNOSIS — O09523 Supervision of elderly multigravida, third trimester: Secondary | ICD-10-CM

## 2014-07-27 DIAGNOSIS — Z3493 Encounter for supervision of normal pregnancy, unspecified, third trimester: Secondary | ICD-10-CM

## 2014-07-27 DIAGNOSIS — O09513 Supervision of elderly primigravida, third trimester: Secondary | ICD-10-CM

## 2014-07-27 DIAGNOSIS — Z331 Pregnant state, incidental: Secondary | ICD-10-CM

## 2014-07-27 LAB — POCT URINALYSIS DIPSTICK
GLUCOSE UA: NEGATIVE
Ketones, UA: NEGATIVE
Leukocytes, UA: NEGATIVE
Nitrite, UA: NEGATIVE
PROTEIN UA: NEGATIVE

## 2014-07-27 NOTE — Progress Notes (Signed)
US [redacted]W[redacted]d c/w dates,EFW 1823g (4lbs) 50%,ant pl grade 1,BPP 8/8,fht 129bpm, RI .67 s/d 3.03,afi 15.82cm

## 2014-07-27 NOTE — Patient Instructions (Signed)
[place] Pediatricians/Family Doctors:  [facility] [phone]            [facility] [phone]                 [facility] [phone] (usually not accepting new patients unless you have family there already, you are always welcome to call and ask)            [facility] ([address] ) [phone]   [place] Pediatricians/Family Doctors:   [facility]: [phone]  [facility]: [phone]   Call the office [phone]) or go to [facility] if:  You begin to have strong, frequent contractions  Your water breaks.  Sometimes it is a big gush of fluid, sometimes it is just a trickle that keeps getting your panties wet or running down your legs  You have vaginal bleeding.  It is normal to have a small amount of spotting if your cervix was checked.   You don't feel your baby moving like normal.  If you don't, get you something to eat and drink and lay down and focus on feeling your baby move.  You should feel at least 10 movements in 2 hours.  If you don't, you should call the office or go to [facility].    Preterm Labor Information Preterm labor is when labor starts at less than 37 weeks of pregnancy. The normal length of a pregnancy is 39 to 41 weeks. CAUSES Often, there is no identifiable underlying cause as to why a woman goes into preterm labor. One of the most common known causes of preterm labor is infection. Infections of the uterus, cervix, vagina, amniotic sac, bladder, kidney, or even the lungs (pneumonia) can cause labor to start. Other suspected causes of preterm labor include:   Urogenital infections, such as yeast infections and bacterial vaginosis.   Uterine abnormalities (uterine shape, uterine septum, fibroids, or bleeding from the placenta).   A cervix that has been operated on (it may fail to stay closed).   Malformations in the fetus.   Multiple  gestations (twins, triplets, and so on).   Breakage of the amniotic sac.  RISK FACTORS  Having a previous history of preterm labor.   Having premature rupture of membranes (PROM).   Having a placenta that covers the opening of the cervix (placenta previa).   Having a placenta that separates from the uterus (placental abruption).   Having a cervix that is too weak to hold the fetus in the uterus (incompetent cervix).   Having too much fluid in the amniotic sac (polyhydramnios).   Taking illegal drugs or smoking while pregnant.   Not gaining enough weight while pregnant.   Being younger than 18 and older than 39 years old.   Having a low socioeconomic status.   Being African American. SYMPTOMS Signs and symptoms of preterm labor include:   Menstrual-like cramps, abdominal pain, or back pain.  Uterine contractions that are regular, as frequent as six in an hour, regardless of their intensity (may be mild or painful).  Contractions that start on the top of the uterus and spread down to the lower abdomen and back.   A sense of increased pelvic pressure.   A watery or bloody mucus discharge that comes from the vagina.  TREATMENT Depending on the length of the pregnancy and other circumstances, your health care provider may suggest bed rest. If necessary, there are medicines that can be given to stop contractions and   to mature the fetal lungs. If labor happens before 34 weeks of pregnancy, a prolonged hospital stay may be recommended. Treatment depends on the condition of both you and the fetus.  WHAT SHOULD YOU DO IF YOU THINK YOU ARE IN PRETERM LABOR? Call your health care provider right away. You will need to go to the hospital to get checked immediately. HOW CAN YOU PREVENT PRETERM LABOR IN FUTURE PREGNANCIES? You should:   Stop smoking if you smoke.  Maintain healthy weight gain and avoid chemicals and drugs that are not necessary.  Be watchful for any  type of infection.  Inform your health care provider if you have a known history of preterm labor. Document Released: 06/30/2003 Document Revised: 12/10/2012 Document Reviewed: 05/12/2012 ExitCare Patient Information 2015 ExitCare, LLC. This information is not intended to replace advice given to you by your health care provider. Make sure you discuss any questions you have with your health care provider.  

## 2014-07-27 NOTE — Progress Notes (Signed)
Low-risk OB appointment [id number] Estimated Date of Delivery: 09/24/14 BP 118/62 mmHg  Pulse 80  Wt 161 lb (73.029 kg)  LMP 12/04/2013  BP, weight, and urine reviewed.  Refer to obstetrical flow sheet for FH & FHR.  Reports good fm.  Denies regular uc's, lof, vb, or uti s/s. No complaints. Reviewed today's normal efw/afi/bpp/dopp u/s for AMA. 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 Notified last UDS still +for THC, states she hasn't had any in ~1 month. Will recheck at next visit.  Discussed ptl s/s, fkc Plan:  Continue routine obstetrical care  F/U in 2wks for OB appointment

## 2014-08-07 ENCOUNTER — Encounter (HOSPITAL_COMMUNITY): Payer: Self-pay | Admitting: Cardiology

## 2014-08-07 ENCOUNTER — Emergency Department (HOSPITAL_COMMUNITY): Payer: Medicaid Other

## 2014-08-07 ENCOUNTER — Emergency Department (HOSPITAL_COMMUNITY)
Admission: EM | Admit: 2014-08-07 | Discharge: 2014-08-07 | Disposition: A | Discharge status: Home / Self Care | Payer: Medicaid Other | Attending: Emergency Medicine | Admitting: Emergency Medicine

## 2014-08-07 DIAGNOSIS — F17210 Nicotine dependence, cigarettes, uncomplicated: Secondary | ICD-10-CM | POA: Insufficient documentation

## 2014-08-07 DIAGNOSIS — O285 Abnormal chromosomal and genetic finding on antenatal screening of mother: Secondary | ICD-10-CM | POA: Insufficient documentation

## 2014-08-07 DIAGNOSIS — Z79899 Other long term (current) drug therapy: Secondary | ICD-10-CM | POA: Diagnosis not present

## 2014-08-07 DIAGNOSIS — N1330 Unspecified hydronephrosis: Secondary | ICD-10-CM | POA: Diagnosis not present

## 2014-08-07 DIAGNOSIS — N201 Calculus of ureter: Secondary | ICD-10-CM | POA: Insufficient documentation

## 2014-08-07 DIAGNOSIS — J45909 Unspecified asthma, uncomplicated: Secondary | ICD-10-CM | POA: Diagnosis not present

## 2014-08-07 DIAGNOSIS — O9989 Other specified diseases and conditions complicating pregnancy, childbirth and the puerperium: Secondary | ICD-10-CM | POA: Diagnosis not present

## 2014-08-07 DIAGNOSIS — O99333 Smoking (tobacco) complicating pregnancy, third trimester: Secondary | ICD-10-CM | POA: Insufficient documentation

## 2014-08-07 DIAGNOSIS — Z3A32 32 weeks gestation of pregnancy: Secondary | ICD-10-CM | POA: Diagnosis not present

## 2014-08-07 DIAGNOSIS — O09523 Supervision of elderly multigravida, third trimester: Secondary | ICD-10-CM | POA: Insufficient documentation

## 2014-08-07 DIAGNOSIS — O9952 Diseases of the respiratory system complicating childbirth: Secondary | ICD-10-CM | POA: Insufficient documentation

## 2014-08-07 DIAGNOSIS — N23 Unspecified renal colic: Secondary | ICD-10-CM

## 2014-08-07 LAB — DIFFERENTIAL
BASOS PCT: 0 % (ref 0–1)
Basophils Absolute: 0 10*3/uL (ref 0.0–0.1)
Eosinophils Absolute: 0.1 10*3/uL (ref 0.0–0.7)
Eosinophils Relative: 1 % (ref 0–5)
LYMPHS PCT: 14 % (ref 12–46)
Lymphs Abs: 1.4 10*3/uL (ref 0.7–4.0)
MONO ABS: 0.4 10*3/uL (ref 0.1–1.0)
Monocytes Relative: 4 % (ref 3–12)
Neutro Abs: 8.1 10*3/uL — ABNORMAL HIGH (ref 1.7–7.7)
Neutrophils Relative %: 82 % — ABNORMAL HIGH (ref 43–77)

## 2014-08-07 LAB — URINALYSIS, ROUTINE W REFLEX MICROSCOPIC
Glucose, UA: NEGATIVE mg/dL
Ketones, ur: NEGATIVE mg/dL
Leukocytes, UA: NEGATIVE
Nitrite: NEGATIVE
PH: 5 (ref 5.0–8.0)
Urobilinogen, UA: 0.2 mg/dL (ref 0.0–1.0)

## 2014-08-07 LAB — CBC
HCT: 36.7 % (ref 36.0–46.0)
HEMOGLOBIN: 12.4 g/dL (ref 12.0–15.0)
MCH: 31.6 pg (ref 26.0–34.0)
MCHC: 33.8 g/dL (ref 30.0–36.0)
MCV: 93.6 fL (ref 78.0–100.0)
PLATELETS: 111 10*3/uL — AB (ref 150–400)
RBC: 3.92 MIL/uL (ref 3.87–5.11)
RDW: 13.7 % (ref 11.5–15.5)
WBC: 9.8 10*3/uL (ref 4.0–10.5)

## 2014-08-07 LAB — URINE MICROSCOPIC-ADD ON

## 2014-08-07 LAB — COMPREHENSIVE METABOLIC PANEL
ALT: 13 U/L (ref 0–35)
AST: 14 U/L (ref 0–37)
Albumin: 3.2 g/dL — ABNORMAL LOW (ref 3.5–5.2)
Alkaline Phosphatase: 62 U/L (ref 39–117)
Anion gap: 7 (ref 5–15)
BUN: 9 mg/dL (ref 6–23)
CO2: 21 mmol/L (ref 19–32)
CREATININE: 0.54 mg/dL (ref 0.50–1.10)
Calcium: 8.6 mg/dL (ref 8.4–10.5)
Chloride: 109 mmol/L (ref 96–112)
GFR calc Af Amer: >90 mL/min (ref >90)
GFR calc non Af Amer: >90 mL/min (ref >90)
Glucose, Bld: 95 mg/dL (ref 70–99)
POTASSIUM: 3.6 mmol/L (ref 3.5–5.1)
Sodium: 137 mmol/L (ref 135–145)
Total Bilirubin: 0.4 mg/dL (ref 0.3–1.2)
Total Protein: 6.4 g/dL (ref 6.0–8.3)

## 2014-08-07 MED ORDER — MORPHINE SULFATE 4 MG/ML IJ SOLN
8.0000 mg | Freq: Once | INTRAMUSCULAR | Status: AC
  Administered 2014-08-07: 4 mg via INTRAVENOUS
  Filled 2014-08-07: qty 2

## 2014-08-07 MED ORDER — SODIUM CHLORIDE 0.9 % IV BOLUS (SEPSIS)
1000.0000 mL | Freq: Once | INTRAVENOUS | Status: AC
  Administered 2014-08-07: 1000 mL via INTRAVENOUS

## 2014-08-07 MED ORDER — ONDANSETRON HCL 4 MG/2ML IJ SOLN
4.0000 mg | Freq: Once | INTRAMUSCULAR | Status: AC
  Administered 2014-08-07: 4 mg via INTRAVENOUS
  Filled 2014-08-07: qty 2

## 2014-08-07 MED ORDER — OXYCODONE-ACETAMINOPHEN 5-325 MG PO TABS: 1.0000 | ORAL_TABLET | ORAL | Status: DC | PRN

## 2014-08-07 MED ORDER — PROMETHAZINE HCL 25 MG/ML IJ SOLN
INTRAMUSCULAR | Status: AC
  Administered 2014-08-07: 25 mg
  Filled 2014-08-07: qty 1

## 2014-08-07 MED ORDER — PROMETHAZINE HCL 25 MG PO TABS: 25.0000 mg | ORAL_TABLET | Freq: Four times a day (QID) | ORAL | Status: DC | PRN

## 2014-08-07 MED ORDER — MORPHINE SULFATE 4 MG/ML IJ SOLN
4.0000 mg | Freq: Once | INTRAMUSCULAR | Status: AC
  Administered 2014-08-07: 4 mg via INTRAVENOUS
  Filled 2014-08-07: qty 1

## 2014-08-07 NOTE — Discharge Instructions (Signed)
Ureteral Colic (Kidney Stones) °Ureteral colic is the result of a condition when kidney stones form inside the kidney. Once kidney stones are formed they may move into the tube that connects the kidney with the bladder (ureter). If this occurs, this condition may cause pain (colic) in the ureter.  °CAUSES  °Pain is caused by stone movement in the ureter and the obstruction caused by the stone. °SYMPTOMS  °The pain comes and goes as the ureter contracts around the stone. The pain is usually intense, sharp, and stabbing in character. The location of the pain may move as the stone moves through the ureter. When the stone is near the kidney the pain is usually located in the back and radiates to the belly (abdomen). When the stone is ready to pass into the bladder the pain is often located in the lower abdomen on the side the stone is located. At this location, the symptoms may mimic those of a urinary tract infection with urinary frequency. Once the stone is located here it often passes into the bladder and the pain disappears completely. °TREATMENT  °· Your caregiver will provide you with medicine for pain relief. °· You may require specialized follow-up X-rays. °· The absence of pain does not always mean that the stone has passed. It may have just stopped moving. If the urine remains completely obstructed, it can cause loss of kidney function or even complete destruction of the involved kidney. It is your responsibility and in your interest that X-rays and follow-ups as suggested by your caregiver are completed. Relief of pain without passage of the stone can be associated with severe damage to the kidney, including loss of kidney function on that side. °· If your stone does not pass on its own, additional measures may be taken by your caregiver to ensure its removal. °HOME CARE INSTRUCTIONS  °· Increase your fluid intake. Water is the preferred fluid since juices containing vitamin C may acidify the urine making it  less likely for certain stones (uric acid stones) to pass. °· Strain all urine. A strainer will be provided. Keep all particulate matter or stones for your caregiver to inspect. °· Take your pain medicine as directed. °· Make a follow-up appointment with your caregiver as directed. °· Remember that the goal is passage of your stone. The absence of pain does not mean the stone is gone. Follow your caregiver's instructions. °· Only take over-the-counter or prescription medicines for pain, discomfort, or fever as directed by your caregiver. °SEEK MEDICAL CARE IF:  °· Pain cannot be controlled with the prescribed medicine. °· You have a fever. °· Pain continues for longer than your caregiver advises it should. °· There is a change in the pain, and you develop chest discomfort or constant abdominal pain. °· You feel faint or pass out. °MAKE SURE YOU:  °· Understand these instructions. °· Will watch your condition. °· Will get help right away if you are not doing well or get worse. °Document Released: 01/17/2005 Document Revised: 08/04/2012 Document Reviewed: 10/04/2010 °ExitCare® Patient Information ©2015 ExitCare, LLC. This information is not intended to replace advice given to you by your health care provider. Make sure you discuss any questions you have with your health care provider. ° °

## 2014-08-07 NOTE — ED Notes (Signed)
Pt called and stated she wanted the pain med now. [facility] and asked for me to wait on giving the morphine until she talks with Physician on call at [facility]. In to advise pt and pt had vomited approx 100cc of emesis. RR at [facility] and [facility]P aware. [facility]

## 2014-08-07 NOTE — Progress Notes (Signed)
[facility] spoke with Christy,RN @[facility]  to discuss plan of care; [facility] then spoke with Dr Penne LashLeggett, who looked over pt's lab work and ultrasound.  Dr Penne LashLeggett spoke with Dr Patria Maneampos and agreed with the current plan of care.

## 2014-08-07 NOTE — ED Notes (Signed)
Abdominal pain and vomiting since yesterday.  8 months pregnant.

## 2014-08-07 NOTE — Progress Notes (Signed)
RR[facility] called to get update on pt after speaking with Dr Leggett-OB attending(told of pt's complaints of back pain, nausea, vomiting; Dr Penne LashLeggett gave orders for a cbc with diff and wants pt's cervix checked.

## 2014-08-07 NOTE — Progress Notes (Signed)
Lauren, RN@[facility]  called [facility] to notify of pt@33  1/7weeks who has c/o nausea, vomiting, left back pain that wraps around to the abdomen.  Pt reports no vaginal bleeding or leaking of fluid.  RROB tried to put pt into obix to monitor fhr, and was unable to get strip to pull up; Lauren,RN checked connetions and said everything was connected; she will call IT to try to resolve issue, but will have to fax fhr strip to RROB to review.  She said that MD plans to give pt IV bolus, Zofran, lab work, and u/a.

## 2014-08-07 NOTE — ED Provider Notes (Signed)
CSN: [id number]     Arrival date & time 08/07/14  [id number] History  This chart was scribed for Azalia BilisKevin Bowyn Mercier, MD by Modena JanskyAlbert Thayil, ED Scribe. This patient was seen in room APA01/APA01 and the patient's care was started at 8:54 AM.    Chief Complaint  Patient presents with  . Abdominal Pain   The history is provided by the patient. No language interpreter was used.   HPI Comments: Brittney Allen is a 39 y.o. female who is presents to the Emergency Department complaining of constant moderate left sided abdominal pain that started yesterday. She reports that she is currently 8 months pregnant and has been having abdominal pain since yesterday. She states that she has also been having associated nausea and vomiting. She reports that the shooting pain radiates to the left side of her back. She states no modifying factors. She reports no prior hx of abdominal pain. She states that she is G5 P3 A1. She reports no complications with her past pregnancies. She denies any dysuria, vaginal bleeding, or leaking of fluids.   Past Medical History  Diagnosis Date  . Asthma   . AMA (advanced maternal age) primigravida 35+ 02/09/2014  . Smoking 02/09/2014  . Asthma in adult 02/09/2014   Past Surgical History  Procedure Laterality Date  . Other surgical history      right arm   Family History  Problem Relation Age of Onset  . Diabetes Mother   . Cirrhosis Mother   . Heart disease Mother   . Other Mother     collapsed lung  . Diabetes Father   . Heart disease Father    History  Substance Use Topics  . Smoking status: Current Every Day Smoker    Types: Cigarettes  . Smokeless tobacco: Never Used  . Alcohol Use: No   OB History    Gravida Para Term Preterm AB TAB SAB Ectopic Multiple Living   5 3 3  0 1 0 1 0 0 3     Review of Systems A complete 10 system review of systems was obtained and all systems are negative except as noted in the HPI and PMH.   Allergies  Review of patient's allergies  indicates no known allergies.  Home Medications   Prior to Admission medications   Medication Sig Start Date End Date Taking? Authorizing Provider  Bisacodyl (LAXATIVE PO) Take 1 tablet by mouth daily as needed (constipation).    Yes Historical Provider, MD  prenatal vitamin w/FE, FA (PRENATAL 1 + 1) 27-1 MG TABS tablet Take 1 tablet by mouth daily at 12 noon. 02/09/14  Yes Adline PotterJennifer A Griffin, NP   BP 131/65 mmHg  Pulse 82  Temp(Src) 97.6 F (36.4 C) (Oral)  Resp 18  Ht 5\' 5"  (1.651 m)  Wt 161 lb (73.029 kg)  BMI 26.79 kg/m2  SpO2 100%  LMP 12/04/2013 Physical Exam  Constitutional: She is oriented to person, place, and time. She appears well-developed and well-nourished. No distress.  HENT:  Head: Normocephalic and atraumatic.  Eyes: EOM are normal.  Neck: Normal range of motion.  Cardiovascular: Normal rate, regular rhythm and normal heart sounds.   Pulmonary/Chest: Effort normal and breath sounds normal.  Abdominal: Soft. She exhibits no distension. There is tenderness.  Mild left sided abdominal TTP. Gravid uterus consistent with dates.   Musculoskeletal: Normal range of motion.  Neurological: She is alert and oriented to person, place, and time.  Skin: Skin is warm and dry.  Psychiatric: She has a  normal mood and affect. Judgment normal.  Nursing note and vitals reviewed.   ED Course  Procedures (including critical care time) DIAGNOSTIC STUDIES: Oxygen Saturation is 100% on RA, normal by my interpretation.    COORDINATION OF CARE: 8:59 AM- Pt advised of plan for treatment which includes medication, radiology, and labs and pt agrees.  Labs Review Labs Reviewed  URINALYSIS, ROUTINE W REFLEX MICROSCOPIC - Abnormal; Notable for the following:    APPearance CLOUDY (*)    Specific Gravity, Urine >1.030 (*)    Hgb urine dipstick MODERATE (*)    Bilirubin Urine SMALL (*)    Protein, ur TRACE (*)    All other components within normal limits  CBC - Abnormal; Notable for  the following:    Platelets 111 (*)    All other components within normal limits  COMPREHENSIVE METABOLIC PANEL - Abnormal; Notable for the following:    Albumin 3.2 (*)    All other components within normal limits  DIFFERENTIAL - Abnormal; Notable for the following:    Neutrophils Relative % 82 (*)    Neutro Abs 8.1 (*)    All other components within normal limits  URINE MICROSCOPIC-ADD ON - Abnormal; Notable for the following:    Squamous Epithelial / LPF MANY (*)    Bacteria, UA MANY (*)    All other components within normal limits  URINE CULTURE  CBC WITH DIFFERENTIAL/PLATELET    Imaging Review US Renal  08/07/2014   CLINICAL DATA:  Abdominal pain  EXAM: RENAL/URINARY TRACT ULTRASOUND COMPLETE  COMPARISON:  None.  FINDINGS: Right Kidney:  Length: 13.5 cm. Echogenicity within normal limits. No mass or hydronephrosis visualized.  Left Kidney:  Length: 14.7 cm.  Mild hydronephrosis is noted.  Bladder:  Appears normal for degree of bladder distention.  IMPRESSION: Mild left hydronephrosis.   Electronically Signed   By: Alcide Clever M.D.   On: 08/07/2014 10:11     EKG Interpretation None      MDM   Final diagnoses:  Abnormal chromosomal and genetic finding on antenatal screening of mother  Supervision of normal pregnancy in first trimester  AMA (advanced maternal age) primigravida 35+, third trimester    Clinical presentation is consistent with a left-sided kidney stone with left ureteral colic.  She has mild hydronephrosis on the left kidney.  Her symptoms with left-sided abdominal pain with radiation towards her left flank are consistent with this.  Urine culture sent.  Much of this urine appears contaminated and not a clean specimen.  At this time will not treat for urinary tract infection as she has no dysuria or urinary frequency.  Pain control at this time.  Patient is fingertip on cervical exam at this time.  No contractions.  Fetal heart rate is good.  We'll reassess and  once her pain is controlled plan on discharge home with outpatient urology follow-up.  12:37 PM Patient is feeling better at this time.  She will like to go home.  Discharge home with OB/GYN and urology follow-up.  Patient understands to return to the ER for new or worsening symptoms.  Urine culture pending.  Home with pain medication and nausea medication.   I personally performed the services described in this documentation, which was scribed in my presence. The recorded information has been reviewed and is accurate.       Azalia Bilis, MD 08/07/14 [id number]

## 2014-08-07 NOTE — ED Notes (Signed)
Patient with no complaints at this time. Respirations even and unlabored. Skin warm/dry. Discharge instructions reviewed with patient at this time. Patient given opportunity to voice concerns/ask questions. Patient discharged at this time and left Emergency Department with steady gait.   

## 2014-08-07 NOTE — ED Notes (Signed)
MD at bedside. 

## 2014-08-09 LAB — URINE CULTURE: Colony Count: 4000

## 2014-08-10 ENCOUNTER — Ambulatory Visit (INDEPENDENT_AMBULATORY_CARE_PROVIDER_SITE_OTHER): Payer: Medicaid Other | Admitting: Obstetrics & Gynecology

## 2014-08-10 ENCOUNTER — Encounter: Payer: Self-pay | Admitting: Obstetrics & Gynecology

## 2014-08-10 VITALS — BP 122/70 | HR 92 | Wt 168.0 lb

## 2014-08-10 DIAGNOSIS — Z3493 Encounter for supervision of normal pregnancy, unspecified, third trimester: Secondary | ICD-10-CM

## 2014-08-10 DIAGNOSIS — Z1389 Encounter for screening for other disorder: Secondary | ICD-10-CM

## 2014-08-10 DIAGNOSIS — Z331 Pregnant state, incidental: Secondary | ICD-10-CM

## 2014-08-10 LAB — POCT URINALYSIS DIPSTICK
Glucose, UA: NEGATIVE
Ketones, UA: NEGATIVE
Leukocytes, UA: NEGATIVE
Nitrite, UA: NEGATIVE
Protein, UA: NEGATIVE

## 2014-08-10 MED ORDER — OXYCODONE-ACETAMINOPHEN 5-325 MG PO TABS: 1.0000 | ORAL_TABLET | ORAL | Status: DC | PRN

## 2014-08-10 NOTE — Addendum Note (Signed)
Addended by: Criss AlvinePULLIAM, Kiel Cockerell G on: 08/10/2014 12:17 PM   Modules accepted: Orders

## 2014-08-10 NOTE — Progress Notes (Signed)
Pt suffering with a kidney stone right now, on phenergen and oxycodone  [id number] 5380w4d Estimated Date of Delivery: 09/24/14  Blood pressure 122/70, pulse 92, weight 168 lb (76.204 kg), last menstrual period 12/04/2013.   BP weight and urine results all reviewed and noted.  Please refer to the obstetrical flow sheet for the fundal height and fetal heart rate documentation:  Patient reports good fetal movement, denies any bleeding and no rupture of membranes symptoms or regular contractions. Patient is without complaints. All questions were answered.  Plan:  Continued routine obstetrical care,   Follow up in 2 weeks for OB appointment, sonogram  Refill percocet

## 2014-08-20 ENCOUNTER — Other Ambulatory Visit: Payer: Self-pay | Admitting: Obstetrics & Gynecology

## 2014-08-24 ENCOUNTER — Ambulatory Visit (INDEPENDENT_AMBULATORY_CARE_PROVIDER_SITE_OTHER): Payer: Medicaid Other | Admitting: Obstetrics & Gynecology

## 2014-08-24 ENCOUNTER — Encounter: Payer: Self-pay | Admitting: Obstetrics & Gynecology

## 2014-08-24 ENCOUNTER — Ambulatory Visit (INDEPENDENT_AMBULATORY_CARE_PROVIDER_SITE_OTHER): Payer: Medicaid Other

## 2014-08-24 VITALS — BP 116/80 | HR 80 | Wt 164.0 lb

## 2014-08-24 DIAGNOSIS — O365931 Maternal care for other known or suspected poor fetal growth, third trimester, fetus 1: Secondary | ICD-10-CM

## 2014-08-24 DIAGNOSIS — O285 Abnormal chromosomal and genetic finding on antenatal screening of mother: Secondary | ICD-10-CM

## 2014-08-24 DIAGNOSIS — Z1389 Encounter for screening for other disorder: Secondary | ICD-10-CM

## 2014-08-24 DIAGNOSIS — Z3493 Encounter for supervision of normal pregnancy, unspecified, third trimester: Secondary | ICD-10-CM

## 2014-08-24 DIAGNOSIS — O09513 Supervision of elderly primigravida, third trimester: Secondary | ICD-10-CM

## 2014-08-24 DIAGNOSIS — Z3491 Encounter for supervision of normal pregnancy, unspecified, first trimester: Secondary | ICD-10-CM

## 2014-08-24 DIAGNOSIS — Z331 Pregnant state, incidental: Secondary | ICD-10-CM

## 2014-08-24 LAB — POCT URINALYSIS DIPSTICK
GLUCOSE UA: NEGATIVE
Leukocytes, UA: NEGATIVE
Nitrite, UA: NEGATIVE
Protein, UA: NEGATIVE
RBC UA: NEGATIVE

## 2014-08-24 NOTE — Addendum Note (Signed)
Addended by: Richardson ChiquitoRAVIS, Raelynne Ludwick M on: 08/24/2014 05:32 PM   Modules accepted: Orders

## 2014-08-24 NOTE — Progress Notes (Signed)
Sonogram: Normal fetal growth and fluid  G5P3013 7398w4d Estimated Date of Delivery: 09/24/14  Blood pressure 116/80, pulse 80, weight 164 lb (74.39 kg), last menstrual period 12/04/2013.   BP weight and urine results all reviewed and noted.  Please refer to the obstetrical flow sheet for the fundal height and fetal heart rate documentation:  Patient reports good fetal movement, denies any bleeding and no rupture of membranes symptoms or regular contractions. Patient is without complaints. All questions were answered.  Plan:  Continued routine obstetrical care,   Follow up in 1 weeks for OB appointment, ob visit

## 2014-08-24 NOTE — Progress Notes (Signed)
US 35+4wks measurements c/w dates,ant pl gr 1, afi 12.25,cephalic,BPP 8/8,RI .60,.63,EFW [id number] 44.1%,limited view of head and cx

## 2014-08-31 ENCOUNTER — Ambulatory Visit (INDEPENDENT_AMBULATORY_CARE_PROVIDER_SITE_OTHER): Payer: Medicaid Other | Admitting: Advanced Practice Midwife

## 2014-08-31 ENCOUNTER — Encounter: Payer: Self-pay | Admitting: Advanced Practice Midwife

## 2014-08-31 VITALS — BP 108/64 | HR 92 | Wt 164.0 lb

## 2014-08-31 DIAGNOSIS — Z1159 Encounter for screening for other viral diseases: Secondary | ICD-10-CM

## 2014-08-31 DIAGNOSIS — Z1389 Encounter for screening for other disorder: Secondary | ICD-10-CM

## 2014-08-31 DIAGNOSIS — Z3493 Encounter for supervision of normal pregnancy, unspecified, third trimester: Secondary | ICD-10-CM

## 2014-08-31 DIAGNOSIS — Z3685 Encounter for antenatal screening for Streptococcus B: Secondary | ICD-10-CM

## 2014-08-31 DIAGNOSIS — Z118 Encounter for screening for other infectious and parasitic diseases: Secondary | ICD-10-CM

## 2014-08-31 DIAGNOSIS — R768 Other specified abnormal immunological findings in serum: Secondary | ICD-10-CM

## 2014-08-31 DIAGNOSIS — Z331 Pregnant state, incidental: Secondary | ICD-10-CM

## 2014-08-31 LAB — POCT URINALYSIS DIPSTICK
GLUCOSE UA: NEGATIVE
Ketones, UA: NEGATIVE
LEUKOCYTES UA: NEGATIVE
NITRITE UA: NEGATIVE
Protein, UA: NEGATIVE

## 2014-08-31 LAB — OB RESULTS CONSOLE GC/CHLAMYDIA
CHLAMYDIA, DNA PROBE: NEGATIVE
GC PROBE AMP, GENITAL: NEGATIVE

## 2014-08-31 MED ORDER — ACYCLOVIR 400 MG PO TABS: 400.0000 mg | ORAL_TABLET | Freq: Three times a day (TID) | ORAL | Status: DC

## 2014-08-31 NOTE — Progress Notes (Signed)
[id number] 3033w4d Estimated Date of Delivery: 09/24/14  Blood pressure 108/64, pulse 92, weight 164 lb (74.39 kg), last menstrual period 12/04/2013.   BP weight and urine results all reviewed and noted.  Please refer to the obstetrical flow sheet for the fundal height and fetal heart rate documentation:  Patient reports good fetal movement, denies any bleeding and no rupture of membranes symptoms or regular contractions. Patient is without complaints other than normal pregnancy complaints. States that she "still has a kidney stone" from 3 weeks ago.  US was negatibve, but sx pointed towards that.  Now I';m not so sure.  Flank only hurts after she has laid on it for a while. Still has hematuria.  All questions were answered.  Plan:  Continued routine obstetrical care,   Follow up in 1 weeks for OB appointment,  Start acyclovir 400mg  TID

## 2014-09-02 LAB — GC/CHLAMYDIA PROBE AMP
Chlamydia trachomatis, NAA: NEGATIVE
Neisseria gonorrhoeae by PCR: NEGATIVE

## 2014-09-04 LAB — CULTURE, BETA STREP (GROUP B ONLY): Strep Gp B Culture: NEGATIVE

## 2014-09-07 ENCOUNTER — Ambulatory Visit (INDEPENDENT_AMBULATORY_CARE_PROVIDER_SITE_OTHER): Payer: Medicaid Other | Admitting: Women's Health

## 2014-09-07 ENCOUNTER — Encounter: Payer: Self-pay | Admitting: Women's Health

## 2014-09-07 VITALS — BP 110/60 | HR 72 | Wt 169.0 lb

## 2014-09-07 DIAGNOSIS — O09523 Supervision of elderly multigravida, third trimester: Secondary | ICD-10-CM

## 2014-09-07 DIAGNOSIS — O26843 Uterine size-date discrepancy, third trimester: Secondary | ICD-10-CM | POA: Diagnosis not present

## 2014-09-07 DIAGNOSIS — Z1389 Encounter for screening for other disorder: Secondary | ICD-10-CM

## 2014-09-07 DIAGNOSIS — Z3A37 37 weeks gestation of pregnancy: Secondary | ICD-10-CM | POA: Diagnosis not present

## 2014-09-07 DIAGNOSIS — Z3493 Encounter for supervision of normal pregnancy, unspecified, third trimester: Secondary | ICD-10-CM

## 2014-09-07 DIAGNOSIS — Z331 Pregnant state, incidental: Secondary | ICD-10-CM

## 2014-09-07 LAB — POCT URINALYSIS DIPSTICK
Blood, UA: NEGATIVE
GLUCOSE UA: NEGATIVE
Ketones, UA: NEGATIVE
NITRITE UA: NEGATIVE
Protein, UA: NEGATIVE

## 2014-09-07 NOTE — Progress Notes (Signed)
Low-risk OB appointment [id number] 2674w4d Estimated Date of Delivery: 09/24/14 BP 110/60 mmHg  Pulse 72  Wt 169 lb (76.658 kg)  LMP 12/04/2013  BP, weight, and urine reviewed.  Refer to obstetrical flow sheet for FH & FHR.  Reports good fm.  Denies regular uc's, lof, vb, or uti s/s. No complaints. Would like work note, began maternity leave 5/10.  Reviewed labor s/s, fkc. Plan:  Efw/afi u/s for s<d, Continue routine obstetrical care  F/U asap for efw/afi u/s for s<d (no visit), then 1wk for OB appointment

## 2014-09-07 NOTE — Patient Instructions (Signed)
Call the office ([phone]) or go to [facility] if:  You begin to have strong, frequent contractions  Your water breaks.  Sometimes it is a big gush of fluid, sometimes it is just a trickle that keeps getting your panties wet or running down your legs  You have vaginal bleeding.  It is normal to have a small amount of spotting if your cervix was checked.   You don't feel your baby moving like normal.  If you don't, get you something to eat and drink and lay down and focus on feeling your baby move.  You should feel at least 10 movements in 2 hours.  If you don't, you should call the office or go to [facility].    Braxton Hicks Contractions Contractions of the uterus can occur throughout pregnancy. Contractions are not always a sign that you are in labor.  WHAT ARE BRAXTON HICKS CONTRACTIONS?  Contractions that occur before labor are called Braxton Hicks contractions, or false labor. Toward the end of pregnancy (32-34 weeks), these contractions can develop more often and may become more forceful. This is not true labor because these contractions do not result in opening (dilatation) and thinning of the cervix. They are sometimes difficult to tell apart from true labor because these contractions can be forceful and people have different pain tolerances. You should not feel embarrassed if you go to the hospital with false labor. Sometimes, the only way to tell if you are in true labor is for your health care provider to look for changes in the cervix. If there are no prenatal problems or other health problems associated with the pregnancy, it is completely safe to be sent home with false labor and await the onset of true labor. HOW CAN YOU TELL THE DIFFERENCE BETWEEN TRUE AND FALSE LABOR? False Labor  The contractions of false labor are usually shorter and not as hard as those of true labor.   The contractions are usually irregular.   The contractions are often felt in the front of  the lower abdomen and in the groin.   The contractions may go away when you walk around or change positions while lying down.   The contractions get weaker and are shorter lasting as time goes on.   The contractions do not usually become progressively stronger, regular, and closer together as with true labor.  True Labor  Contractions in true labor last 30-70 seconds, become very regular, usually become more intense, and increase in frequency.   The contractions do not go away with walking.   The discomfort is usually felt in the top of the uterus and spreads to the lower abdomen and low back.   True labor can be determined by your health care provider with an exam. This will show that the cervix is dilating and getting thinner.  WHAT TO REMEMBER  Keep up with your usual exercises and follow other instructions given by your health care provider.   Take medicines as directed by your health care provider.   Keep your regular prenatal appointments.   Eat and drink lightly if you think you are going into labor.   If Braxton Hicks contractions are making you uncomfortable:   Change your position from lying down or resting to walking, or from walking to resting.   Sit and rest in a tub of warm water.   Drink 2-3 glasses of water. Dehydration may cause these contractions.   Do slow and deep breathing several times an hour.    WHEN SHOULD I SEEK IMMEDIATE MEDICAL CARE? Seek immediate medical care if:  Your contractions become stronger, more regular, and closer together.   You have fluid leaking or gushing from your vagina.   You have a fever.   You pass blood-tinged mucus.   You have vaginal bleeding.   You have continuous abdominal pain.   You have low back pain that you never had before.   You feel your baby's head pushing down and causing pelvic pressure.   Your baby is not moving as much as it used to.  Document Released: 04/09/2005 Document  Revised: 04/14/2013 Document Reviewed: 01/19/2013 Exit[employer] Patient Information 2015 [employer]. This information is not intended to replace advice given to you by your health care provider. Make sure you discuss any questions you have with your health care provider.  

## 2014-09-08 ENCOUNTER — Other Ambulatory Visit: Payer: Self-pay | Admitting: Women's Health

## 2014-09-08 ENCOUNTER — Ambulatory Visit (INDEPENDENT_AMBULATORY_CARE_PROVIDER_SITE_OTHER): Payer: Medicaid Other

## 2014-09-08 DIAGNOSIS — O26843 Uterine size-date discrepancy, third trimester: Secondary | ICD-10-CM

## 2014-09-08 DIAGNOSIS — O09513 Supervision of elderly primigravida, third trimester: Secondary | ICD-10-CM

## 2014-09-08 DIAGNOSIS — Z3493 Encounter for supervision of normal pregnancy, unspecified, third trimester: Secondary | ICD-10-CM

## 2014-09-08 DIAGNOSIS — O285 Abnormal chromosomal and genetic finding on antenatal screening of mother: Secondary | ICD-10-CM

## 2014-09-08 LAB — US OB FOLLOW UP

## 2014-09-08 NOTE — Progress Notes (Signed)
US today at 37+[redacted] weeks GA. Single, active female fetus in a cephalic presentation with FHR of 155 bpm. Bilateral ovaries not visualized today; however, no adnexal abnormality visualized. Anterior Gr 1 placenta. Fluid is within normal limits with an AFI of 16.78cm and SVP of 5.21cm. EFW today of 3017g (36%) which is consistent with dating. BPP 8/8. UAD are normal for this GA with a S/D 2.55 and RI 0.6.

## 2014-09-09 LAB — US OB FOLLOW UP

## 2014-09-13 ENCOUNTER — Inpatient Hospital Stay (HOSPITAL_COMMUNITY)
Admission: AD | Admit: 2014-09-13 | Discharge: 2014-09-16 | DRG: 767 | Disposition: A | Discharge status: Home / Self Care | Payer: Medicaid Other | Source: Ambulatory Visit | Attending: Obstetrics & Gynecology | Admitting: Obstetrics & Gynecology

## 2014-09-13 ENCOUNTER — Encounter (HOSPITAL_COMMUNITY): Payer: Self-pay | Admitting: *Deleted

## 2014-09-13 DIAGNOSIS — O99324 Drug use complicating childbirth: Secondary | ICD-10-CM | POA: Diagnosis present

## 2014-09-13 DIAGNOSIS — F17210 Nicotine dependence, cigarettes, uncomplicated: Secondary | ICD-10-CM | POA: Diagnosis present

## 2014-09-13 DIAGNOSIS — O4292 Full-term premature rupture of membranes, unspecified as to length of time between rupture and onset of labor: Secondary | ICD-10-CM | POA: Diagnosis not present

## 2014-09-13 DIAGNOSIS — F1210 Cannabis abuse, uncomplicated: Secondary | ICD-10-CM | POA: Diagnosis present

## 2014-09-13 DIAGNOSIS — J45909 Unspecified asthma, uncomplicated: Secondary | ICD-10-CM | POA: Diagnosis present

## 2014-09-13 DIAGNOSIS — O9952 Diseases of the respiratory system complicating childbirth: Secondary | ICD-10-CM | POA: Diagnosis present

## 2014-09-13 DIAGNOSIS — Z302 Encounter for sterilization: Secondary | ICD-10-CM

## 2014-09-13 DIAGNOSIS — O09523 Supervision of elderly multigravida, third trimester: Secondary | ICD-10-CM

## 2014-09-13 DIAGNOSIS — O99334 Smoking (tobacco) complicating childbirth: Secondary | ICD-10-CM | POA: Diagnosis present

## 2014-09-13 DIAGNOSIS — Z3A38 38 weeks gestation of pregnancy: Secondary | ICD-10-CM | POA: Diagnosis present

## 2014-09-13 HISTORY — DX: Cannabis use, unspecified, uncomplicated: F12.90

## 2014-09-13 HISTORY — DX: Herpesviral infection, unspecified: B00.9

## 2014-09-13 LAB — OB RESULTS CONSOLE GBS: STREP GROUP B AG: NEGATIVE

## 2014-09-13 LAB — POCT FERN TEST: POCT Fern Test: POSITIVE

## 2014-09-14 ENCOUNTER — Encounter: Payer: Medicaid Other | Admitting: Women's Health

## 2014-09-14 ENCOUNTER — Inpatient Hospital Stay (HOSPITAL_COMMUNITY): Payer: Medicaid Other | Admitting: Anesthesiology

## 2014-09-14 ENCOUNTER — Encounter (HOSPITAL_COMMUNITY): Payer: Self-pay

## 2014-09-14 DIAGNOSIS — O4292 Full-term premature rupture of membranes, unspecified as to length of time between rupture and onset of labor: Secondary | ICD-10-CM | POA: Diagnosis present

## 2014-09-14 DIAGNOSIS — O9952 Diseases of the respiratory system complicating childbirth: Secondary | ICD-10-CM | POA: Diagnosis present

## 2014-09-14 DIAGNOSIS — F17210 Nicotine dependence, cigarettes, uncomplicated: Secondary | ICD-10-CM | POA: Diagnosis not present

## 2014-09-14 DIAGNOSIS — J45909 Unspecified asthma, uncomplicated: Secondary | ICD-10-CM | POA: Diagnosis present

## 2014-09-14 DIAGNOSIS — O99324 Drug use complicating childbirth: Secondary | ICD-10-CM | POA: Diagnosis not present

## 2014-09-14 DIAGNOSIS — O99334 Smoking (tobacco) complicating childbirth: Secondary | ICD-10-CM | POA: Diagnosis present

## 2014-09-14 DIAGNOSIS — F1210 Cannabis abuse, uncomplicated: Secondary | ICD-10-CM | POA: Diagnosis present

## 2014-09-14 DIAGNOSIS — O09523 Supervision of elderly multigravida, third trimester: Secondary | ICD-10-CM | POA: Diagnosis not present

## 2014-09-14 DIAGNOSIS — Z302 Encounter for sterilization: Secondary | ICD-10-CM | POA: Diagnosis not present

## 2014-09-14 DIAGNOSIS — Z3A38 38 weeks gestation of pregnancy: Secondary | ICD-10-CM

## 2014-09-14 LAB — CBC
HCT: 32.7 % — ABNORMAL LOW (ref 36.0–46.0)
Hemoglobin: 11.4 g/dL — ABNORMAL LOW (ref 12.0–15.0)
MCH: 31.7 pg (ref 26.0–34.0)
MCHC: 34.9 g/dL (ref 30.0–36.0)
MCV: 90.8 fL (ref 78.0–100.0)
Platelets: 120 10*3/uL — ABNORMAL LOW (ref 150–400)
RBC: 3.6 MIL/uL — AB (ref 3.87–5.11)
RDW: 13.4 % (ref 11.5–15.5)
WBC: 7.5 10*3/uL (ref 4.0–10.5)

## 2014-09-14 LAB — RAPID URINE DRUG SCREEN, HOSP PERFORMED
AMPHETAMINES: NOT DETECTED
BENZODIAZEPINES: NOT DETECTED
Barbiturates: NOT DETECTED
Cocaine: NOT DETECTED
Opiates: POSITIVE — AB
TETRAHYDROCANNABINOL: NOT DETECTED

## 2014-09-14 LAB — MRSA PCR SCREENING: MRSA by PCR: NEGATIVE

## 2014-09-14 LAB — TYPE AND SCREEN
ABO/RH(D): A POS
ANTIBODY SCREEN: NEGATIVE

## 2014-09-14 LAB — ABO/RH: ABO/RH(D): A POS

## 2014-09-14 LAB — RPR: RPR Ser Ql: NONREACTIVE

## 2014-09-14 MED ORDER — DIBUCAINE 1 % RE OINT: 1.0000 "application " | TOPICAL_OINTMENT | RECTAL | Status: DC | PRN

## 2014-09-14 MED ORDER — OXYCODONE-ACETAMINOPHEN 5-325 MG PO TABS: 2.0000 | ORAL_TABLET | ORAL | Status: DC | PRN

## 2014-09-14 MED ORDER — OXYTOCIN BOLUS FROM INFUSION
500.0000 mL | INTRAVENOUS | Status: DC
  Administered 2014-09-14: 500 mL via INTRAVENOUS

## 2014-09-14 MED ORDER — PRENATAL MULTIVITAMIN CH
1.0000 | ORAL_TABLET | Freq: Every day | ORAL | Status: DC
  Administered 2014-09-16: 1 via ORAL
  Filled 2014-09-14 (×2): qty 1

## 2014-09-14 MED ORDER — DIPHENHYDRAMINE HCL 25 MG PO CAPS: 25.0000 mg | ORAL_CAPSULE | Freq: Four times a day (QID) | ORAL | Status: DC | PRN

## 2014-09-14 MED ORDER — ZOLPIDEM TARTRATE 5 MG PO TABS: 5.0000 mg | ORAL_TABLET | Freq: Every evening | ORAL | Status: DC | PRN

## 2014-09-14 MED ORDER — OXYCODONE-ACETAMINOPHEN 5-325 MG PO TABS: 1.0000 | ORAL_TABLET | ORAL | Status: DC | PRN

## 2014-09-14 MED ORDER — BUPIVACAINE HCL (PF) 0.25 % IJ SOLN
INTRAMUSCULAR | Status: DC | PRN
Start: 2014-09-14 — End: 2014-09-14
  Administered 2014-09-14 (×2): 4 mL

## 2014-09-14 MED ORDER — SIMETHICONE 80 MG PO CHEW: 80.0000 mg | CHEWABLE_TABLET | ORAL | Status: DC | PRN

## 2014-09-14 MED ORDER — OXYCODONE-ACETAMINOPHEN 5-325 MG PO TABS
2.0000 | ORAL_TABLET | ORAL | Status: DC | PRN
Start: 2014-09-14 — End: 2014-09-16
  Administered 2014-09-15 – 2014-09-16 (×5): 2 via ORAL
  Filled 2014-09-14 (×6): qty 2

## 2014-09-14 MED ORDER — ONDANSETRON HCL 4 MG/2ML IJ SOLN: 4.0000 mg | Freq: Four times a day (QID) | INTRAMUSCULAR | Status: DC | PRN

## 2014-09-14 MED ORDER — FENTANYL 2.5 MCG/ML BUPIVACAINE 1/10 % EPIDURAL INFUSION (WH - ANES)
14.0000 mL/h | INTRAMUSCULAR | Status: DC | PRN
  Administered 2014-09-14: 14 mL/h via EPIDURAL
  Filled 2014-09-14: qty 125

## 2014-09-14 MED ORDER — WITCH HAZEL-GLYCERIN EX PADS: 1.0000 "application " | MEDICATED_PAD | CUTANEOUS | Status: DC | PRN

## 2014-09-14 MED ORDER — LACTATED RINGERS IV SOLN
500.0000 mL | INTRAVENOUS | Status: DC | PRN
  Administered 2014-09-14: 500 mL via INTRAVENOUS

## 2014-09-14 MED ORDER — LIDOCAINE-EPINEPHRINE (PF) 2 %-1:200000 IJ SOLN
INTRAMUSCULAR | Status: DC | PRN
  Administered 2014-09-14: 5 mL

## 2014-09-14 MED ORDER — IBUPROFEN 600 MG PO TABS
600.0000 mg | ORAL_TABLET | Freq: Four times a day (QID) | ORAL | Status: DC
Start: 2014-09-14 — End: 2014-09-16
  Administered 2014-09-14 – 2014-09-16 (×9): 600 mg via ORAL
  Filled 2014-09-14 (×9): qty 1

## 2014-09-14 MED ORDER — TETANUS-DIPHTH-ACELL PERTUSSIS 5-2.5-18.5 LF-MCG/0.5 IM SUSP
0.5000 mL | Freq: Once | INTRAMUSCULAR | Status: AC
  Administered 2014-09-16: 0.5 mL via INTRAMUSCULAR
  Filled 2014-09-14: qty 0.5

## 2014-09-14 MED ORDER — PHENYLEPHRINE 40 MCG/ML (10ML) SYRINGE FOR IV PUSH (FOR BLOOD PRESSURE SUPPORT)
80.0000 ug | PREFILLED_SYRINGE | INTRAVENOUS | Status: DC | PRN
  Filled 2014-09-14: qty 2
  Filled 2014-09-14: qty 20

## 2014-09-14 MED ORDER — BENZOCAINE-MENTHOL 20-0.5 % EX AERO: 1.0000 "application " | INHALATION_SPRAY | CUTANEOUS | Status: DC | PRN

## 2014-09-14 MED ORDER — ACETAMINOPHEN 325 MG PO TABS: 650.0000 mg | ORAL_TABLET | ORAL | Status: DC | PRN

## 2014-09-14 MED ORDER — EPHEDRINE 5 MG/ML INJ
10.0000 mg | INTRAVENOUS | Status: DC | PRN
  Filled 2014-09-14: qty 2

## 2014-09-14 MED ORDER — LIDOCAINE HCL (PF) 1 % IJ SOLN
30.0000 mL | INTRAMUSCULAR | Status: DC | PRN
  Filled 2014-09-14: qty 30

## 2014-09-14 MED ORDER — LANOLIN HYDROUS EX OINT: TOPICAL_OINTMENT | CUTANEOUS | Status: DC | PRN

## 2014-09-14 MED ORDER — OXYTOCIN 40 UNITS IN LACTATED RINGERS INFUSION - SIMPLE MED
1.0000 m[IU]/min | INTRAVENOUS | Status: DC
  Administered 2014-09-14: 2 m[IU]/min via INTRAVENOUS
  Filled 2014-09-14: qty 1000

## 2014-09-14 MED ORDER — NICOTINE 14 MG/24HR TD PT24
14.0000 mg | MEDICATED_PATCH | TRANSDERMAL | Status: DC
  Administered 2014-09-14 – 2014-09-16 (×3): 14 mg via TRANSDERMAL
  Filled 2014-09-14 (×4): qty 1

## 2014-09-14 MED ORDER — CITRIC ACID-SODIUM CITRATE 334-500 MG/5ML PO SOLN: 30.0000 mL | ORAL | Status: DC | PRN

## 2014-09-14 MED ORDER — TERBUTALINE SULFATE 1 MG/ML IJ SOLN
0.2500 mg | Freq: Once | INTRAMUSCULAR | Status: DC | PRN
  Filled 2014-09-14: qty 1

## 2014-09-14 MED ORDER — DIPHENHYDRAMINE HCL 50 MG/ML IJ SOLN: 12.5000 mg | INTRAMUSCULAR | Status: DC | PRN

## 2014-09-14 MED ORDER — FLEET ENEMA 7-19 GM/118ML RE ENEM: 1.0000 | ENEMA | RECTAL | Status: DC | PRN

## 2014-09-14 MED ORDER — ONDANSETRON HCL 4 MG/2ML IJ SOLN: 4.0000 mg | INTRAMUSCULAR | Status: DC | PRN

## 2014-09-14 MED ORDER — ONDANSETRON HCL 4 MG PO TABS: 4.0000 mg | ORAL_TABLET | ORAL | Status: DC | PRN

## 2014-09-14 MED ORDER — OXYTOCIN 40 UNITS IN LACTATED RINGERS INFUSION - SIMPLE MED: 62.5000 mL/h | INTRAVENOUS | Status: DC

## 2014-09-14 MED ORDER — SENNOSIDES-DOCUSATE SODIUM 8.6-50 MG PO TABS
2.0000 | ORAL_TABLET | ORAL | Status: DC
  Administered 2014-09-14 – 2014-09-16 (×2): 2 via ORAL
  Filled 2014-09-14 (×2): qty 2

## 2014-09-14 MED ORDER — LACTATED RINGERS IV SOLN
INTRAVENOUS | Status: DC
  Administered 2014-09-14: 01:00:00 via INTRAVENOUS

## 2014-09-14 NOTE — Anesthesia Preprocedure Evaluation (Signed)
Anesthesia Evaluation  Patient identified by MRN, date of birth, ID band Patient awake    Reviewed: Allergy & Precautions, NPO status , Patient's Chart, lab work & pertinent test results  History of Anesthesia Complications Negative for: history of anesthetic complications  Airway Mallampati: II   Neck ROM: Full    Dental  (+) Teeth Intact   Pulmonary asthma , Current Smoker,  breath sounds clear to auscultation        Cardiovascular negative cardio ROS  Rhythm:Regular     Neuro/Psych negative neurological ROS  negative psych ROS   GI/Hepatic negative GI ROS, Neg liver ROS,   Endo/Other  negative endocrine ROS  Renal/GU negative Renal ROS     Musculoskeletal   Abdominal   Peds  Hematology  (+) anemia ,   Anesthesia Other Findings   Reproductive/Obstetrics (+) Pregnancy                             Anesthesia Physical Anesthesia Plan  ASA: II  Anesthesia Plan: Epidural   Post-op Pain Management:    Induction:   Airway Management Planned:   Additional Equipment:   Intra-op Plan:   Post-operative Plan:   Informed Consent: I have reviewed the patients History and Physical, chart, labs and discussed the procedure including the risks, benefits and alternatives for the proposed anesthesia with the patient or authorized representative who has indicated his/her understanding and acceptance.     Plan Discussed with: Anesthesiologist  Anesthesia Plan Comments:         Anesthesia Quick Evaluation

## 2014-09-14 NOTE — Progress Notes (Signed)
Small bleeding at epidural site evaluated by LD RN and felt to be within normal limits

## 2014-09-14 NOTE — Plan of Care (Signed)
Problem: Consults Goal: Birthing Suites Patient Information Press F2 to bring up selections list Outcome: Completed/Met Date Met:  09/14/14  Pt 37-[redacted] weeks EGA

## 2014-09-14 NOTE — H&P (Signed)
LABOR ADMISSION HISTORY AND PHYSICAL  Brittney Allen is a 39 y.o. female [phone] with IUP at [redacted]w[redacted]d by 6 week ultrasound presenting for leakage of fluid. She reports +FMs, No LOF, no VB, no blurry vision, headaches or peripheral edema, and RUQ pain.  She plans on bottle feeding. She request BTL prior to discharge for birth control.  Dating: By 6 week ultrasound --->  Estimated Date of Delivery: 09/24/14  Sono:    , CWD, normal anatomy, cephalic presentation, 3017g, 62% EFW   Prenatal History/Complications:  [facility]  FO[facility]m estranged  Dating By 6wk U/S  Pap 02/09/14: neg w/ -HRHPV  GC/CT Initial:     -/-           36+wks:  -/-  Genetic Screen NT/IT: Pos for DS 1:12   NIPS: declined  CF screen declined  Anatomic [place] Normal female; nasal bone +, nuchal fold normal, no clubbing/clenched fists  Flu vaccine 02/09/14  Tdap Recommended ~ 28wks  Glucose Screen  2 hr  74/124/79  GBS neg  Feed Preference bottle  Contraception BTL in hospital, consent signed 06/29/14  [facility]ision yes at [facility]  Childbirth Classes declined  Pediatrician [facility]  HSV2 positive  Past Medical History: Past Medical History  Diagnosis Date  . Asthma   . AMA (advanced maternal age) primigravida 35+ 02/09/2014  . Smoking 02/09/2014  . Asthma in adult 02/09/2014    Past Surgical History: Past Surgical History  Procedure Laterality Date  . Other surgical history      right arm  . Arm surgery      broken forearm    Obstetrical History: OB History    Gravida Para Term Preterm AB TAB SAB Ectopic Multiple Living   0 1 0 1 0 0 3      Social History: History   Social History  . Marital Status: Single    Spouse Name: N/A  . Number of Children: N/A  . Years of Education: N/A   Social History Main Topics  . Smoking status: Current Every Day Smoker -- 1.00 packs/day for 25 years    Types: Cigarettes  . Smokeless tobacco: Never Used  . Alcohol Use: No  . Drug Use:  No  . Sexual Activity: Not Currently    Birth Control/ Protection: None   Other Topics Concern  . None   Social History Narrative    Family History: Family History  Problem Relation Age of Onset  . Diabetes Mother   . Cirrhosis Mother   . Heart disease Mother   . Other Mother     collapsed lung  . Diabetes Father   . Heart disease Father     Allergies: No Known Allergies  Prescriptions prior to admission  Medication Sig Dispense Refill Last Dose  . acyclovir (ZOVIRAX) 400 MG tablet Take 1 tablet (400 mg total) by mouth 3 (three) times daily. 90 tablet 2 Taking  . Bisacodyl (LAXATIVE PO) Take 1 tablet by mouth daily as needed (constipation).    Not Taking  . prenatal vitamin w/FE, FA (PRENATAL 1 + 1) 27-1 MG TABS tablet Take 1 tablet by mouth daily at 12 noon. 30 each 11 Taking     Review of Systems   All systems reviewed and negative except as stated in HPI  Blood pressure 119/66, pulse 98, temperature 98.7 F (37.1 C), temperature source Oral, resp. rate 16, height  (1.651 m), weight 169 lb (76.658 kg), last menstrual period  12/04/2013. General appearance: alert, cooperative and no distress Lungs: clear to auscultation bilaterally Heart: regular rate and rhythm Abdomen: soft, non-tender; bowel sounds normal Pelvic: 3/80/-1 Extremities: Homans sign is negative, no sign of DVT Presentation: cephalic Fetal monitoringBaseline: 120 bpm, Variability: Good {> 6 bpm), Accelerations: Reactive and Decelerations: Absent Uterine activity     Prenatal labs: ABO, Rh: A/POS/-- (10/20 1218) Antibody: Negative (03/08 0902) Rubella:   RPR: Non Reactive (03/08 0902)  HBsAg: NEGATIVE (10/20 1218)  HIV: NONREACTIVE (10/20 1218)  GBS: Negative (05/23 0000)  Diabetes screen 2 hr  74/124/79 Genetic screening  NT/IT: Pos for DS 1:12   NIPS: declined Anatomy US Normal female; nasal bone +, nuchal fold normal, no clubbing/clenched fists  Prenatal Transfer Tool  Maternal  Diabetes: No Genetic Screening: Abnormal:  Results: Elevated risk of Trisomy 21 Declined NIPT Maternal Ultrasounds/Referrals: Normal Fetal Ultrasounds or other Referrals:  None Maternal Substance Abuse:  No Significant Maternal Medications:  None Significant Maternal Lab Results: Lab values include: Group B Strep negative  Results for orders placed or performed during the hospital encounter of 09/13/14 (from the past 24 hour(s))  Fern Test   Collection Time: 09/13/14 11:52 PM  Result Value Ref Range   POCT Fern Test Positive = ruptured amniotic membanes     Patient Active Problem List   Diagnosis Date Noted  . Normal labor 09/14/2014  . 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 Marijuana use 06/29/2014  . Abnormal chromosomal and genetic finding on antenatal screening of mother 04/20/2014  . Supervision of normal pregnancy 02/09/2014  . AMA (advanced maternal age) primigravida 35+ 02/09/2014  . Smoking 02/09/2014  . Asthma in adult 02/09/2014    Assessment: Brittney MilesRebecca Allen is a 39 y.o. [id number] at 5134w4d here for term PROM  #Labor:Start pitocin given favorable cervical examination.  #Pain: Epidural prn.  #FWB: Category I #ID:  GBS negative #MOF: breast #MOC:BTL #Circ:  Yes, outpatient  William DaltonMcEachern, Ashwin Tibbs 09/14/2014, 12:21 AM

## 2014-09-14 NOTE — Progress Notes (Signed)
UR chart review completed.  

## 2014-09-14 NOTE — Progress Notes (Signed)
Spoke with CRNA about patient's complaint of pain at epidural site and drainage noted on pillow case.  No additional drainage noted on actual epidural site and tape.  Some lightly blood tinged fluid noted underneath tape.  CRNA stated she would call someone to come and look at the epidural.   [name], [name]

## 2014-09-14 NOTE — Anesthesia Procedure Notes (Signed)
Epidural Patient location during procedure: OB  Staffing Anesthesiologist: Deigo Alonso, CHRIS Performed by: anesthesiologist   Preanesthetic Checklist Completed: patient identified, surgical consent, pre-op evaluation, timeout performed, IV checked, risks and benefits discussed and monitors and equipment checked  Epidural Patient position: sitting Prep: site prepped and draped and DuraPrep Patient monitoring: heart rate, cardiac monitor, continuous pulse ox and blood pressure Approach: midline Location: L4-L5 Injection technique: LOR saline  Needle:  Needle type: Tuohy  Needle gauge: 17 G Needle length: 9 cm Needle insertion depth: 8 cm Catheter type: closed end flexible Catheter size: 19 Gauge Catheter at skin depth: 15 cm Test dose: negative and 2% lidocaine with Epi 1:200 K  Assessment Events: blood not aspirated, injection not painful, no injection resistance, negative IV test and no paresthesia  Additional Notes H+P and labs checked, risks and benefits discussed with the patient, consent obtained, procedure tolerated well and without complications.  Reason for block:procedure for pain   

## 2014-09-14 NOTE — Anesthesia Postprocedure Evaluation (Signed)
Anesthesia Post Note  Patient: Brittney MilesRebecca Heffley  Procedure(s) Performed: * No procedures listed *  Anesthesia type: Epidural  Patient location: Mother/Baby  Post pain: Pain level controlled  Post assessment: Post-op Vital signs reviewed  Last Vitals:  Filed Vitals:   09/14/14 1215  BP: 123/64  Pulse: 66  Temp: 36.9 C  Resp: 18    Post vital signs: Reviewed  Level of consciousness:alert  Complications: No apparent anesthesia complications

## 2014-09-15 ENCOUNTER — Encounter (HOSPITAL_COMMUNITY)
Admission: AD | Disposition: A | Discharge status: Home / Self Care | Payer: Self-pay | Source: Ambulatory Visit | Attending: Obstetrics & Gynecology

## 2014-09-15 ENCOUNTER — Inpatient Hospital Stay (HOSPITAL_COMMUNITY): Payer: Medicaid Other | Admitting: Anesthesiology

## 2014-09-15 DIAGNOSIS — Z302 Encounter for sterilization: Secondary | ICD-10-CM

## 2014-09-15 HISTORY — PX: TUBAL LIGATION: SHX77

## 2014-09-15 LAB — US OB FOLLOW UP

## 2014-09-15 SURGERY — LIGATION, FALLOPIAN TUBE, POSTPARTUM
Anesthesia: Epidural | Site: Abdomen

## 2014-09-15 MED ORDER — FAMOTIDINE 20 MG PO TABS
40.0000 mg | ORAL_TABLET | Freq: Once | ORAL | Status: AC
  Administered 2014-09-15: 40 mg via ORAL
  Filled 2014-09-15: qty 2

## 2014-09-15 MED ORDER — METOCLOPRAMIDE HCL 10 MG PO TABS
10.0000 mg | ORAL_TABLET | Freq: Once | ORAL | Status: AC
  Administered 2014-09-15: 10 mg via ORAL
  Filled 2014-09-15: qty 1

## 2014-09-15 MED ORDER — MIDAZOLAM HCL 2 MG/2ML IJ SOLN
INTRAMUSCULAR | Status: AC
  Filled 2014-09-15: qty 2

## 2014-09-15 MED ORDER — HYDROMORPHONE HCL 1 MG/ML IJ SOLN
INTRAMUSCULAR | Status: AC
  Filled 2014-09-15: qty 1

## 2014-09-15 MED ORDER — MEPERIDINE HCL 25 MG/ML IJ SOLN: 6.2500 mg | INTRAMUSCULAR | Status: DC | PRN

## 2014-09-15 MED ORDER — ONDANSETRON HCL 4 MG/2ML IJ SOLN
INTRAMUSCULAR | Status: DC | PRN
  Administered 2014-09-15: 4 mg via INTRAVENOUS

## 2014-09-15 MED ORDER — BUPIVACAINE HCL (PF) 0.25 % IJ SOLN
INTRAMUSCULAR | Status: AC
  Filled 2014-09-15: qty 30

## 2014-09-15 MED ORDER — FENTANYL CITRATE (PF) 100 MCG/2ML IJ SOLN
INTRAMUSCULAR | Status: AC
  Filled 2014-09-15: qty 2

## 2014-09-15 MED ORDER — MIDAZOLAM HCL 5 MG/5ML IJ SOLN
INTRAMUSCULAR | Status: DC | PRN
  Administered 2014-09-15: 2 mg via INTRAVENOUS
  Administered 2014-09-15 (×2): 1 mg via INTRAVENOUS

## 2014-09-15 MED ORDER — ACETAMINOPHEN 10 MG/ML IV SOLN
1000.0000 mg | Freq: Once | INTRAVENOUS | Status: AC
  Administered 2014-09-15: 1000 mg via INTRAVENOUS
  Filled 2014-09-15: qty 100

## 2014-09-15 MED ORDER — PROPOFOL 10 MG/ML IV BOLUS
INTRAVENOUS | Status: DC | PRN
  Administered 2014-09-15: 10 mg via INTRAVENOUS
  Administered 2014-09-15: 20 mg via INTRAVENOUS
  Administered 2014-09-15 (×2): 10 mg via INTRAVENOUS

## 2014-09-15 MED ORDER — METOCLOPRAMIDE HCL 5 MG/ML IJ SOLN: 10.0000 mg | Freq: Once | INTRAMUSCULAR | Status: AC | PRN

## 2014-09-15 MED ORDER — FENTANYL CITRATE (PF) 100 MCG/2ML IJ SOLN
INTRAMUSCULAR | Status: DC | PRN
  Administered 2014-09-15: 50 ug via INTRAVENOUS
  Administered 2014-09-15: 100 ug via INTRAVENOUS
  Administered 2014-09-15: 50 ug via INTRAVENOUS

## 2014-09-15 MED ORDER — SODIUM BICARBONATE 8.4 % IV SOLN
INTRAVENOUS | Status: DC | PRN
  Administered 2014-09-15: 7 mL via EPIDURAL
  Administered 2014-09-15: 3 mL via EPIDURAL
  Administered 2014-09-15: 2 mL via EPIDURAL
  Administered 2014-09-15 (×2): 5 mL via EPIDURAL
  Administered 2014-09-15: 3 mL via EPIDURAL

## 2014-09-15 MED ORDER — KETOROLAC TROMETHAMINE 30 MG/ML IJ SOLN
INTRAMUSCULAR | Status: DC | PRN
  Administered 2014-09-15: 30 mg via INTRAVENOUS

## 2014-09-15 MED ORDER — SODIUM BICARBONATE 8.4 % IV SOLN
INTRAVENOUS | Status: AC
Start: 2014-09-15 — End: 2014-09-15
  Filled 2014-09-15: qty 50

## 2014-09-15 MED ORDER — LACTATED RINGERS IV SOLN: INTRAVENOUS | Status: DC

## 2014-09-15 MED ORDER — FENTANYL CITRATE (PF) 100 MCG/2ML IJ SOLN
INTRAMUSCULAR | Status: AC
Start: 2014-09-15 — End: 2014-09-15
  Filled 2014-09-15: qty 2

## 2014-09-15 MED ORDER — HYDROMORPHONE HCL 1 MG/ML IJ SOLN
0.2500 mg | INTRAMUSCULAR | Status: DC | PRN
  Administered 2014-09-15 (×4): 0.5 mg via INTRAVENOUS

## 2014-09-15 MED ORDER — LIDOCAINE-EPINEPHRINE (PF) 2 %-1:200000 IJ SOLN
INTRAMUSCULAR | Status: AC
  Filled 2014-09-15: qty 20

## 2014-09-15 MED ORDER — BUPIVACAINE HCL (PF) 0.25 % IJ SOLN
INTRAMUSCULAR | Status: DC | PRN
  Administered 2014-09-15: 17 mL

## 2014-09-15 MED ORDER — 0.9 % SODIUM CHLORIDE (POUR BTL) OPTIME
TOPICAL | Status: DC | PRN
  Administered 2014-09-15: 1000 mL

## 2014-09-15 MED ORDER — LACTATED RINGERS IV SOLN
INTRAVENOUS | Status: DC | PRN
  Administered 2014-09-15 (×2): via INTRAVENOUS

## 2014-09-15 MED ORDER — ONDANSETRON HCL 4 MG/2ML IJ SOLN
INTRAMUSCULAR | Status: AC
  Filled 2014-09-15: qty 2

## 2014-09-15 SURGICAL SUPPLY — 20 items
BLADE SURG 11 STRL SS (BLADE) ×2 IMPLANT
CHLORAPREP W/TINT 26ML (MISCELLANEOUS) ×2 IMPLANT
CLIP FILSHIE TUBAL LIGA STRL (Clip) ×2 IMPLANT
CLOTH BEACON ORANGE TIMEOUT ST (SAFETY) ×2 IMPLANT
DRSG OPSITE POSTOP 3X4 (GAUZE/BANDAGES/DRESSINGS) ×2 IMPLANT
GLOVE BIO SURGEON STRL SZ 6.5 (GLOVE) ×2 IMPLANT
GLOVE BIOGEL PI IND STRL 7.0 (GLOVE) ×1 IMPLANT
GLOVE BIOGEL PI INDICATOR 7.0 (GLOVE) ×1
GOWN STRL REUS W/TWL LRG LVL3 (GOWN DISPOSABLE) ×4 IMPLANT
NEEDLE HYPO 22GX1.5 SAFETY (NEEDLE) ×2 IMPLANT
NS IRRIG 1000ML POUR BTL (IV SOLUTION) ×2 IMPLANT
PACK ABDOMINAL MINOR (CUSTOM PROCEDURE TRAY) ×2 IMPLANT
SPONGE LAP 4X18 X RAY DECT (DISPOSABLE) ×2 IMPLANT
SUT VIC AB 0 CT1 27 (SUTURE) ×1
SUT VIC AB 0 CT1 27XBRD ANBCTR (SUTURE) ×1 IMPLANT
SUT VICRYL 4-0 PS2 18IN ABS (SUTURE) ×2 IMPLANT
SYR CONTROL 10ML LL (SYRINGE) ×2 IMPLANT
TOWEL OR 17X24 6PK STRL BLUE (TOWEL DISPOSABLE) ×4 IMPLANT
TRAY FOLEY BAG SILVER LF 16FR (SET/KITS/TRAYS/PACK) ×2 IMPLANT
WATER STERILE IRR 1000ML POUR (IV SOLUTION) ×2 IMPLANT

## 2014-09-15 NOTE — Transfer of Care (Signed)
Immediate Anesthesia Transfer of Care Note  Patient: Brittney MilesRebecca Gerlich  Procedure(s) Performed: Procedure(s): POST PARTUM TUBAL LIGATION (N/A)  Patient Location: PACU  Anesthesia Type:Epidural  Level of Consciousness: awake, alert  and oriented  Airway & Oxygen Therapy: Patient Spontanous Breathing  Post-op Assessment: Report given to RN and Post -op Vital signs reviewed and stable  Post vital signs: Reviewed and stable  Last Vitals:  Filed Vitals:   09/15/14 0845  BP: 128/63  Pulse: 75  Temp: 36.5 C  Resp: 20    Complications: No apparent anesthesia complications

## 2014-09-15 NOTE — Anesthesia Postprocedure Evaluation (Signed)
  Anesthesia Post-op Note  Patient: Brittney MilesRebecca Allen  Procedure(s) Performed: Procedure(s): POST PARTUM TUBAL LIGATION (N/A)  Patient Location: PACU  Anesthesia Type:Epidural  Level of Consciousness: awake, alert  and oriented  Airway and Oxygen Therapy: Patient Spontanous Breathing  Post-op Pain: mild  Post-op Assessment: Post-op Vital signs reviewed, Patient's Cardiovascular Status Stable, Respiratory Function Stable, Patent Airway, No signs of Nausea or vomiting, Pain level controlled, No headache and No backache  Post-op Vital Signs: Reviewed and stable  Last Vitals:  Filed Vitals:   09/15/14 1230  BP: 119/63  Pulse: 83  Temp:   Resp: 20    Complications: No apparent anesthesia complications

## 2014-09-15 NOTE — Progress Notes (Signed)
Post Partum Day 1 Subjective:  Brittney Allen is a 39 y.o. [id number] 2361w4d s/p SVD@0420 .  No acute events overnight.  Pt denies problems with ambulating, voiding or po intake.  She denies nausea or vomiting.  Pain is moderately controlled with Motrin.  She endorses lower back pain and vaginal soreness rated 5/10. She has had flatus. She has not had bowel movement.  Lochia Small.  Plan for birth control is BTL (scheduled for today at 10:30AM).  Method of Feeding: Bottlefeeding. Pt is anxious about having local anesthesia during BTL and would prefer general anesthesia.  Objective: Blood pressure 118/60, pulse 79, temperature 98.3 F (36.8 C), temperature source Oral, resp. rate 18, height 5\' 5"  (1.651 m), weight 76.658 kg (169 lb), last menstrual period 12/04/2013, SpO2 99 %, unknown if currently breastfeeding.  Physical Exam:  General: alert, cooperative, and anxious, lying in bed Lochia:normal flow Abdomen: +BS, soft, nontender,  Uterine Fundus: firm, 1 finger below umbilicus DVT Evaluation: No evidence of DVT seen on physical exam. Extremities: No edema   Recent Labs  09/14/14 0030  HGB 11.4*  HCT 32.7*    Assessment/Plan:  ASSESSMENT: Brittney Allen is a 39 y.o. [id number] 3661w4d s/p SVD. She is recovering well.  Plan:  1. She will discuss with the anesthesiologist regarding her method of anesthesia. 2. Continue to monitor. BTL today at 10:30AM. Discharge home tomorrow.  3. Social work consult for UDS positive for THC earlier in pregnancy.   LOS: 1 day   [name] 09/15/2014, 7:37 AM

## 2014-09-15 NOTE — Op Note (Signed)
Brittney MilesRebecca Harrower 09/13/2014 - 09/15/2014  PREOPERATIVE DIAGNOSIS:  Multiparity, undesired fertility  POSTOPERATIVE DIAGNOSIS:  Multiparity, undesired fertility  PROCEDURE:  Postpartum Bilateral Tubal Sterilization using Filshie Clips   ANESTHESIA:  Epidural  COMPLICATIONS:  None immediate.  ESTIMATED BLOOD LOSS:  Less than 20 ml.  FLUIDS: 1000 ml LR.  URINE OUTPUT:  50 ml of clear urine.  INDICATIONS: 39 y.o. [id number]  with undesired fertility,status post vaginal delivery, desires permanent sterilization. Risks and benefits of procedure discussed with patient including permanence of method, bleeding, infection, injury to surrounding organs and need for additional procedures. Risk failure of 0.5-1% with increased risk of ectopic gestation if pregnancy occurs was also discussed with patient.   FINDINGS:  Normal uterus, tubes, and ovaries.  TECHNIQUE:  The patient was taken to the operating room where her epidural anesthesia was dosed up to surgical level and found to be adequate.  She was then placed in the dorsal supine position and prepped and draped in sterile fashion.  After an adequate timeout was performed, attention was turned to the patient's abdomen where a small transverse skin incision was made under the umbilical fold. The incision was taken down to the layer of fascia using the scalpel, and fascia was incised, and extended bilaterally using Mayo scissors. The peritoneum was entered in a sharp fashion. Attention was then turned to the patient's uterus, and left fallopian tube was identified and followed out to the fimbriated end.  A Filshie clip was placed on the left fallopian tube about 2 cm from the cornual attachment, with care given to incorporate the underlying mesosalpinx.  A similar process was carried out on the right side allowing for bilateral tubal sterilization.  Good hemostasis was noted overall.  Local analgesia was drizzled on both operative sites.The instruments were then  removed from the patient's abdomen and the fascial incision was repaired with 0 Vicryl, and the skin was closed with a 4-0 Vicryl subcuticular stitch. The patient tolerated the procedure well.  Sponge, lap, and needle counts were correct times two.  The patient was then taken to the recovery room awake and in stable condition.   Adam PhenixJames G Charyl Minervini, MD 09/15/2014 11:35 AM

## 2014-09-15 NOTE — Addendum Note (Signed)
Addendum  created 09/15/14 1218 by Mal AmabileMichael Trinda Harlacher, MD   Modules edited: Anesthesia LDA, Lines/Drains/Airways Properties Editor   Lines/Drains/Airways Properties Editor:  Properties of line/drain/airway/wound Epidural Catheter 09/14/14 have been modified.

## 2014-09-15 NOTE — Addendum Note (Signed)
Addendum  created 09/15/14 1607 by Graciela HusbandsWynn O Mister Krahenbuhl, CRNA   Modules edited: Notes Section   Notes Section:  File: [id number]

## 2014-09-15 NOTE — Anesthesia Preprocedure Evaluation (Addendum)
Anesthesia Evaluation  Patient identified by MRN, date of birth, ID band Patient awake    Reviewed: Allergy & Precautions, NPO status , Patient's Chart, lab work & pertinent test results  History of Anesthesia Complications Negative for: history of anesthetic complications  Airway Mallampati: II   Neck ROM: Full    Dental  (+) Teeth Intact   Pulmonary asthma , Current Smoker,  breath sounds clear to auscultation        Cardiovascular negative cardio ROS  Rhythm:Regular     Neuro/Psych negative neurological ROS  negative psych ROS   GI/Hepatic negative GI ROS, Neg liver ROS, (+)     substance abuse  marijuana use,   Endo/Other  negative endocrine ROS  Renal/GU negative Renal ROS     Musculoskeletal negative musculoskeletal ROS (+)   Abdominal   Peds  Hematology  (+) anemia ,   Anesthesia Other Findings   Reproductive/Obstetrics Desires sterilization post partum                            Anesthesia Physical  Anesthesia Plan  ASA: II  Anesthesia Plan: Epidural   Post-op Pain Management:    Induction:   Airway Management Planned:   Additional Equipment:   Intra-op Plan:   Post-operative Plan:   Informed Consent: I have reviewed the patients History and Physical, chart, labs and discussed the procedure including the risks, benefits and alternatives for the proposed anesthesia with the patient or authorized representative who has indicated his/her understanding and acceptance.     Plan Discussed with: Anesthesiologist, CRNA and Surgeon  Anesthesia Plan Comments:         Anesthesia Quick Evaluation

## 2014-09-15 NOTE — Progress Notes (Signed)
CLINICAL SOCIAL WORK MATERNAL/CHILD NOTE  Patient Details  Name: Brittney Allen MRN: [id number] Date of Birth: 09/14/2014  Date:  09/15/2014  Clinical Social Worker Initiating Note:  Loleta Books, LCSW Date/ Time Initiated:  09/15/14/0915     Child's Name:  Brittney Allen   Legal Guardian:  Mother   Need for Interpreter:  None   Date of Referral:  09/14/14     Reason for Referral:  Current Substance Use/Substance Use During Pregnancy    Referral Source:  [facility]   Address:  [address] [place], [place] [address]  Phone number:  [phone]   Household Members:  Minor Children, Parents   Natural Supports (not living in the home):  Immediate Family   Professional Supports: None   Employment:   Did not assess  Type of Work:    N/A  Education:    N/A  Surveyor, quantity Resources:  Medicaid   Other Resources:  Sales executive , [facility]   Cultural/Religious Considerations Which May Impact Care:  None reported.  Strengths:  Ability to meet basic needs , Home prepared for child    Risk Factors/Current Problems:  1) Substance use during pregnancy: MOB presents with THC use during pregnancy (+UDS in October and March).  Infant's UDS is positive for opiates, MOB has a prescription for percocet.  2) MOB continues to adjust to the infant's arrival since she reported that it has been numerous years since she has had an infant.   Cognitive State:  Able to Concentrate , Alert , Goal Oriented , Linear Thinking    Mood/Affect:  Calm , Relaxed , Euthymic    CSW Assessment:  CSW received referral due to [facility] use during pregnancy. MOB presented in a calm and pleasant mood. MOB holding infant during the entire visit, appears to be bonding well.  MOB was a limited historian as she was not forthcoming with her thoughts and feelings as she prepares to transition to the postpartum period.  MOB indicated feelings of stress and being overwhelmed since it has been numerous years since  she has had an infant (children are 38, 56, and 70 years old), but was not forthcoming with exact thoughts and feelings.  MOB endorsed feeling emotional/tearful during the pregnancy, but shared belief that it was within normal range for a woman who was pregnant.  MOB denied additional mental health history and denied history of postpartum depression/anxiety.  MOB agreed to contact her medical provider if she notes symptoms. She stated that she feels supported by her parents (with whom she lives with), and shared that her other children are also excited and supportive.   MOB admitted to [facility] use during the pregnancy. She was vague about her exact use, and her reports are incongruent with her drug screens.  She reported no use since she has learned that she was pregnant; however, MOB had +UDS in October and March.  CSW provided education on the hospital drug screen policy, and shared that infant's UDS is positive for opiates; however, MOB has a prescription for percocet.  MOB shared that she is aware of potential risk for withdrawal since she smokes cigarettes and utilized her percocet prescription. She expressed confidence in her ability to monitor for behaviors of withdrawal.   MOB acknowledged that CPS will be contacted if the MDS is positive for any substance that she does not have a prescription for.  MOB denied additional questions or concerns about the policy.   MOB denied additional questions, concerns, or  needs. MOB agreed to contact CSW if needs arise.   CSW Plan/Description:   1)Patient/Family Education- Perinatal mood and anxiety disorders, hospital drug screen policy 2) CSW to monitor MDS and will notify CPS if positive. 3)No Further Intervention Required/No Barriers to Discharge    Kelby FamVenning, Spencer Cardinal N, LCSW 09/15/2014, 12:48 PM

## 2014-09-15 NOTE — Progress Notes (Signed)
S/P SVD, has epidural, requests PP BTL, permanent sterilization. The procedure and the risk of anesthesia, bleeding, infection, bowel and bladder injury, failure (1/200) and ectopic pregnancy were discussed and her questions were answered. The procedure is scheduled today.  Adam PhenixJames G Lashaundra Lehrmann, MD 09/15/2014 10:12 AM

## 2014-09-15 NOTE — Anesthesia Postprocedure Evaluation (Signed)
Anesthesia Post Note  Patient: Brittney MilesRebecca Devinney  Procedure(s) Performed: Procedure(s) (LRB): POST PARTUM TUBAL LIGATION (N/A)  Anesthesia type: Epidural  Patient location: Mother/Baby  Post pain: Pain level controlled  Post assessment: Post-op Vital signs reviewed  Last Vitals:  Filed Vitals:   09/15/14 1425  BP: 134/70  Pulse: 62  Temp: 36.6 C  Resp: 16    Post vital signs: Reviewed  Level of consciousness:alert  Complications: No apparent anesthesia complications

## 2014-09-16 ENCOUNTER — Encounter (HOSPITAL_COMMUNITY): Payer: Self-pay | Admitting: Obstetrics & Gynecology

## 2014-09-16 MED ORDER — IBUPROFEN 600 MG PO TABS: 600.0000 mg | ORAL_TABLET | Freq: Four times a day (QID) | ORAL | Status: AC | PRN

## 2014-09-16 NOTE — Discharge Instructions (Signed)

## 2014-09-21 NOTE — Discharge Summary (Signed)
Obstetric Discharge Summary Reason for Admission: rupture of membranes Prenatal Procedures: none Intrapartum Procedures: spontaneous vaginal delivery Postpartum Procedures: P.P. tubal ligation Complications-Operative and Postpartum: none HEMOGLOBIN  Date Value Ref Range Status  09/14/2014 11.4* 12.0 - 15.0 g/dL Final   HCT  Date Value Ref Range Status  09/14/2014 32.7* 36.0 - 46.0 % Final   Ms Brittney Allen is a 39yo [id number] who was admitted after midnight on 09/14/14 w/ PROM. Pitocin was started due to her favorable cx, and she progressed to SVD within a few hours. On PPD#1 she had a BTL, and by PPD#2 (also POD#1 from BTL) she was deemed to be stable and ready for discharge. She was bottlefeeding. She also had had a SW consult due to +opiates in UDS as well as multiple + UDS for opiates during preg as well as THC. There were no barriers to discharge identified.  Physical Exam:  General: alert, cooperative and no distress  Lungs: nl effort Heart: RRR Lochia: appropriate Uterine Fundus: firm BTL Incision: healing well DVT Evaluation: No evidence of DVT seen on physical exam.  Discharge Diagnoses: Term Pregnancy-delivered  Discharge Information: Date: 09/21/2014 Activity: pelvic rest Diet: routine Medications: PNV and Ibuprofen Condition: stable Instructions: refer to practice specific booklet Discharge to: home Follow-up Information    Follow up with [facility]. Schedule an appointment as soon as possible for a visit in 4 weeks.   Why:  For your postpartum appointment.   Contact information:   [address] [place] [phone]      Newborn Data: Live born female  Birth Weight: 6 lb 3.8 oz (2830 g) APGAR: 9, 9  Home with mother.  SHAW, KIMBERLY CNM 09/21/2014, 1:23 AM

## 2014-10-08 ENCOUNTER — Encounter: Payer: Self-pay | Admitting: *Deleted

## 2014-10-28 ENCOUNTER — Ambulatory Visit (INDEPENDENT_AMBULATORY_CARE_PROVIDER_SITE_OTHER): Payer: Medicaid Other | Admitting: Advanced Practice Midwife

## 2014-10-28 ENCOUNTER — Encounter: Payer: Self-pay | Admitting: Advanced Practice Midwife

## 2014-10-28 NOTE — Progress Notes (Signed)
  Brittney MilesRebecca Allen is a 39 y.o. who presents for a postpartum visit. She is 6 weeks postpartum following a spontaneous vaginal delivery. I have fully reviewed the prenatal and intrapartum course. The delivery was at 38.4 gestational weeks.  Anesthesia: epidural. Postpartum course has been uneventful. Baby's course has been uneventful. Baby is feeding by bottle. Bleeding: no bleeding. Bowel function is normal. Bladder function is normal. Patient is sexually active. Contraception method is tubal ligation. Postpartum depression screening: negative.   Current outpatient prescriptions:  .  Bisacodyl (LAXATIVE PO), Take 1 tablet by mouth daily as needed (constipation). , Disp: , Rfl:  .  ibuprofen (ADVIL,MOTRIN) 600 MG tablet, Take 1 tablet (600 mg total) by mouth every 6 (six) hours as needed. (Patient not taking: Reported on 10/28/2014), Disp: 30 tablet, Rfl: 0 .  prenatal vitamin w/FE, FA (PRENATAL 1 + 1) 27-1 MG TABS tablet, Take 1 tablet by mouth daily at 12 noon. (Patient not taking: Reported on 10/28/2014), Disp: 30 each, Rfl: 11  Review of Systems   Constitutional: Negative for fever and chills Eyes: Negative for visual disturbances Respiratory: Negative for shortness of breath, dyspnea Cardiovascular: Negative for chest pain or palpitations  Gastrointestinal: Negative for vomiting, diarrhea and constipation Genitourinary: Negative for dysuria and urgency Musculoskeletal: Negative for back pain, joint pain, myalgias  Neurological: Negative for dizziness and headaches   Objective:     Filed Vitals:   10/28/14 0920  BP: 130/88  Pulse: 76   General:  alert, cooperative and no distress   Breasts:  negative  Lungs: clear to auscultation bilaterally  Heart:  regular rate and rhythm  Abdomen: Soft, nontender   Vulva:  normal  Vagina: normal vagina  Cervix:  closed  Corpus: Well involuted     Rectal Exam: no hemorrhoids        Assessment:    normal postpartum exam.  Plan:    1.  Contraception: tubal ligation 2. Follow up in: 10/18 for pap or as needed.

## 2014-11-04 ENCOUNTER — Ambulatory Visit: Payer: Medicaid Other | Admitting: Advanced Practice Midwife

## 2014-11-17 ENCOUNTER — Telehealth: Payer: Self-pay | Admitting: Obstetrics & Gynecology

## 2014-11-17 NOTE — Telephone Encounter (Signed)
Pt states she started her first period on Sunday, July 24. Is having to wear a tampon and large pad. No symptoms of dizziness, fatigue. Pt informed per Joellyn Haff, CNM is normal to have heavy bleeding first period after delivery, call office back with symptoms of dizziness, fatigue. Pt verbalized understanding.

## 2016-01-20 IMAGING — US US RENAL
1 series · 14 of 25 positions shown · non-contrast
Comparison: None.

CLINICAL DATA: Abdominal pain

EXAM:
RENAL/URINARY TRACT ULTRASOUND COMPLETE

[Series 1: us renal · 0.23mm/px · 14 of 40 slices shown]
[im 1/40]
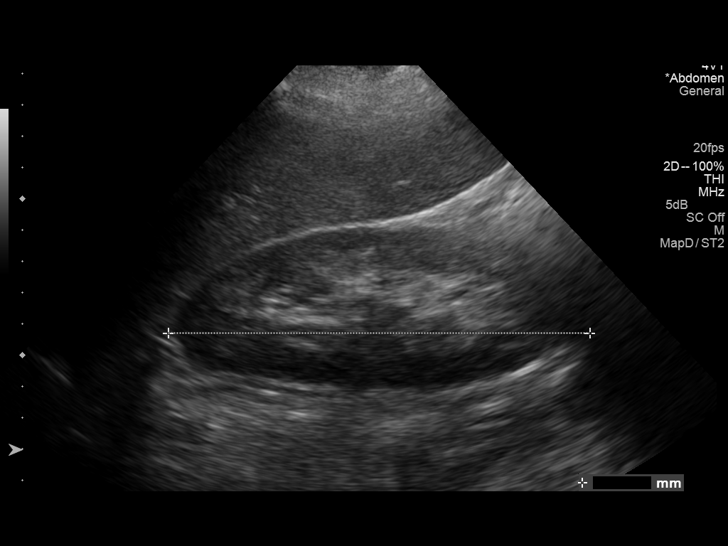
[im 4/40]
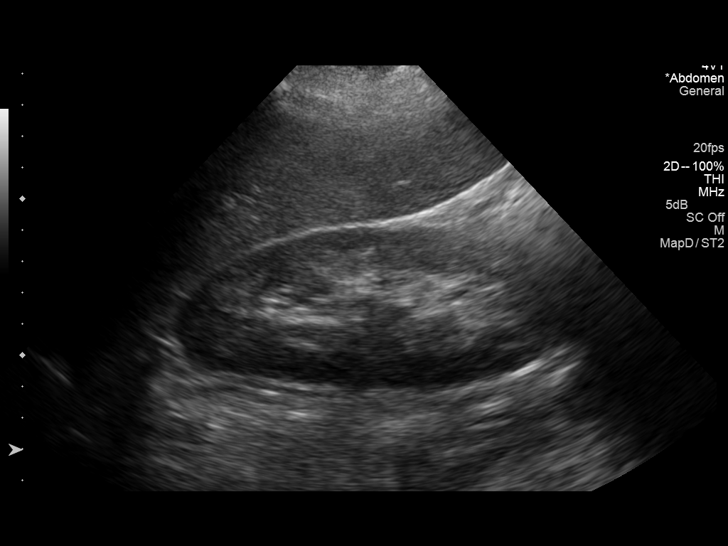
[im 7/40]
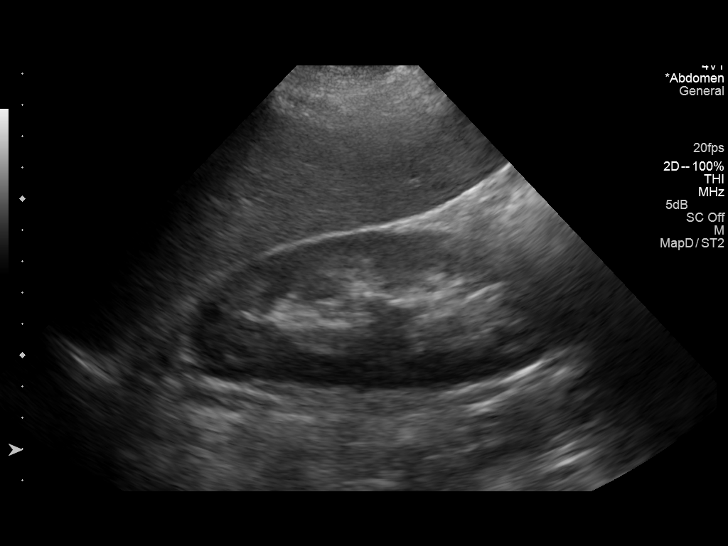
[im 10/40]
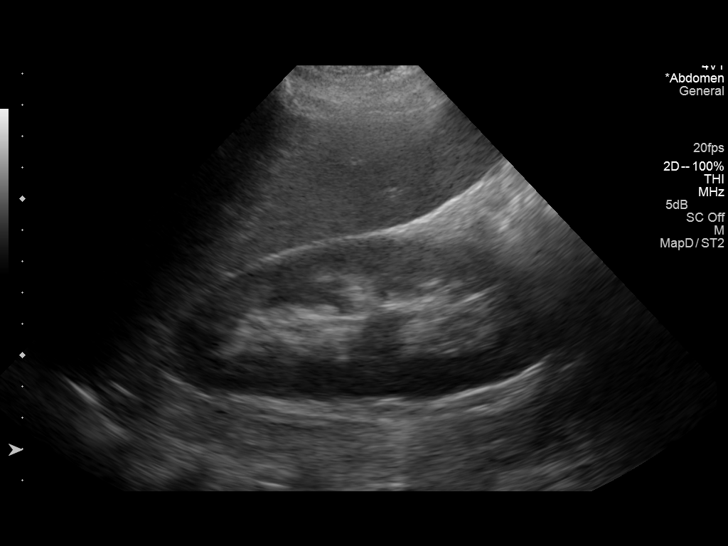
[im 14/40]
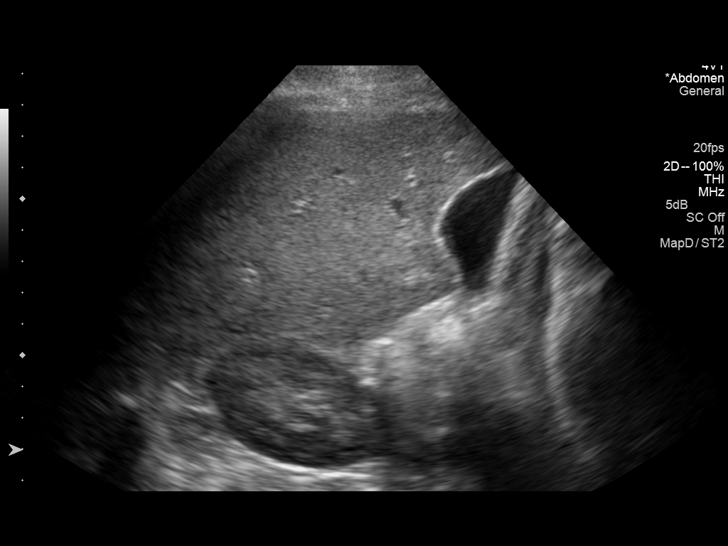
[im 15/40]
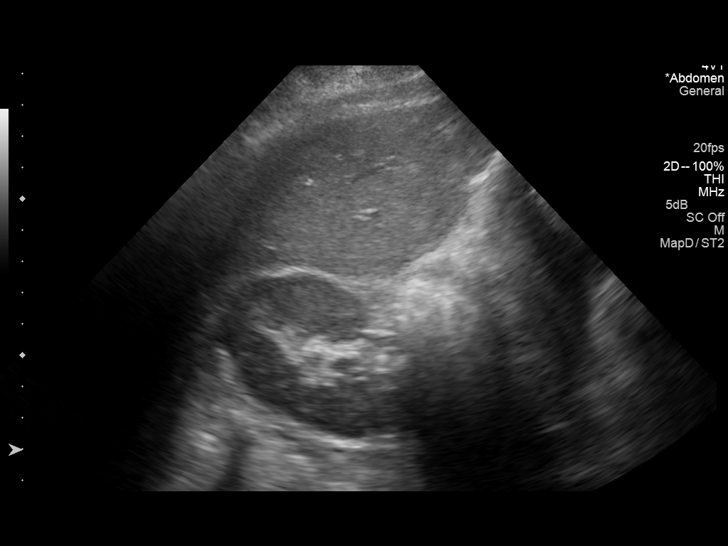
[im 18/40]
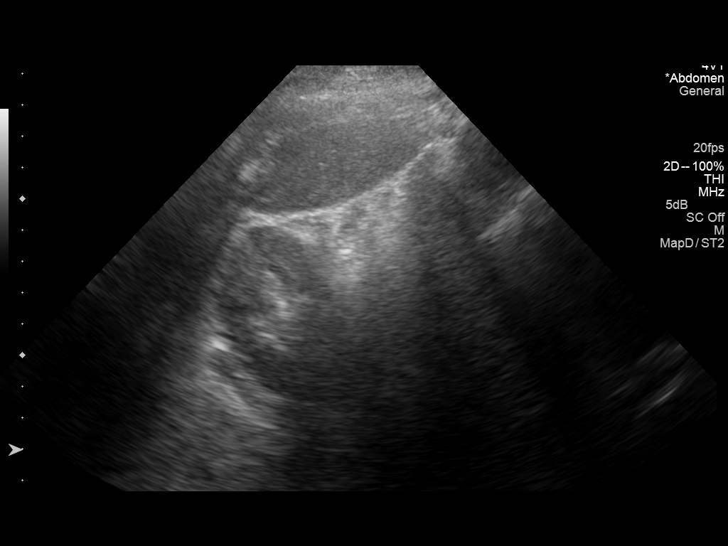
[im 22/40]
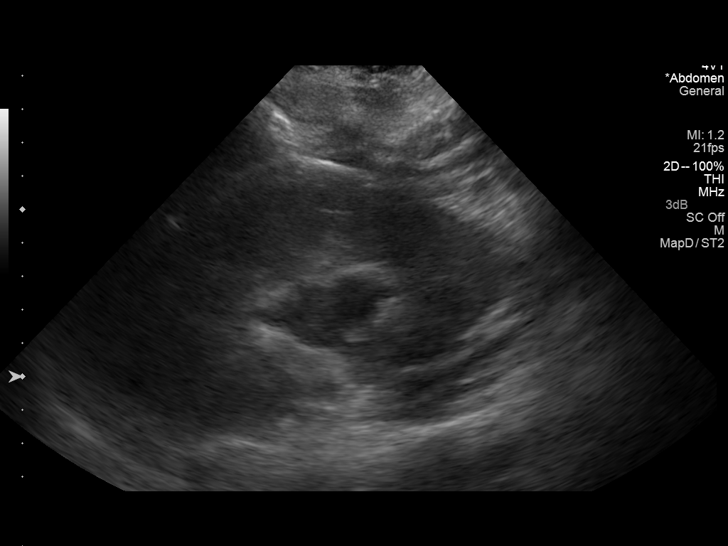
[im 25/40]
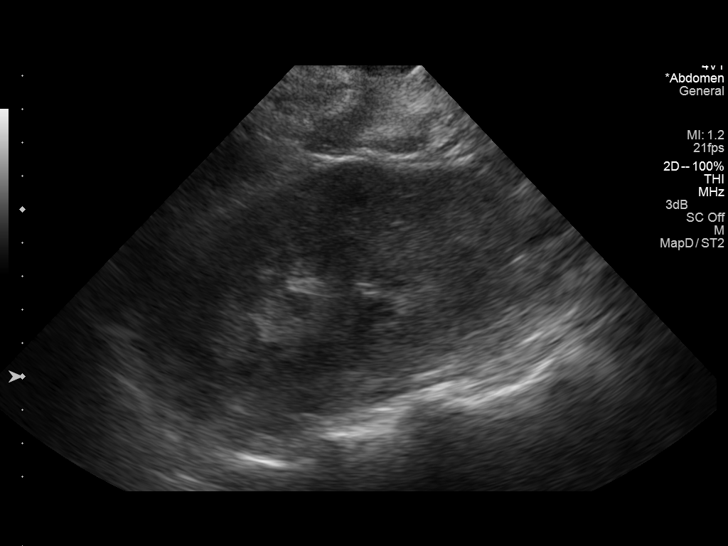
[im 27/40]
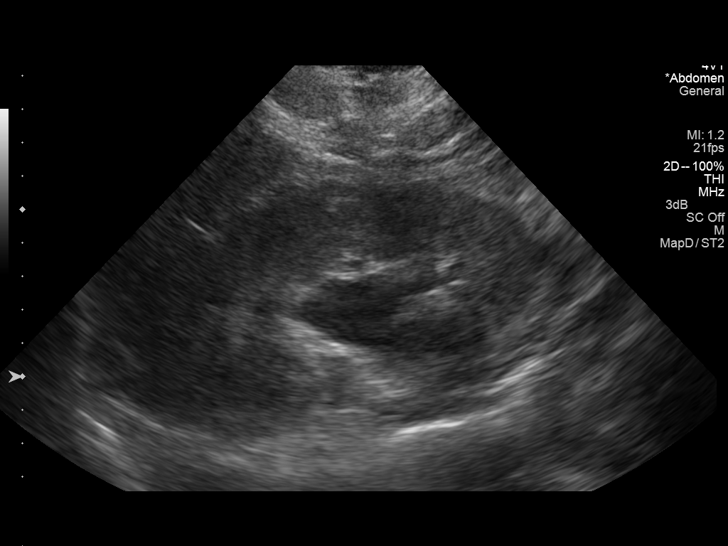
[im 30/40]
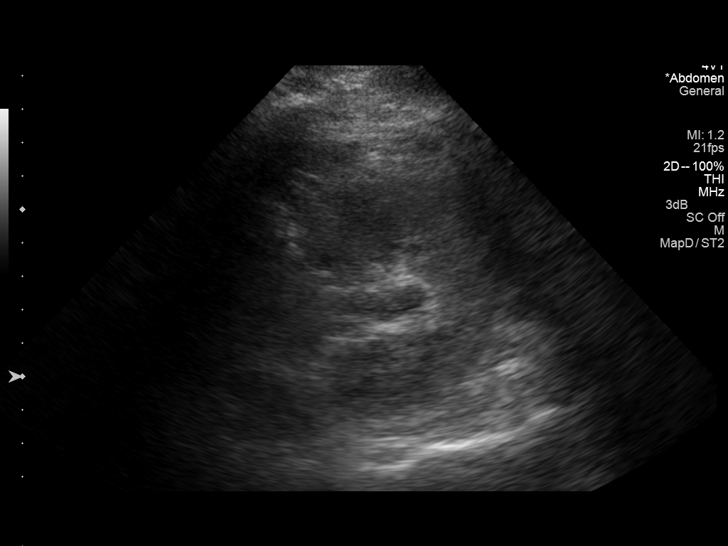
[im 33/40]
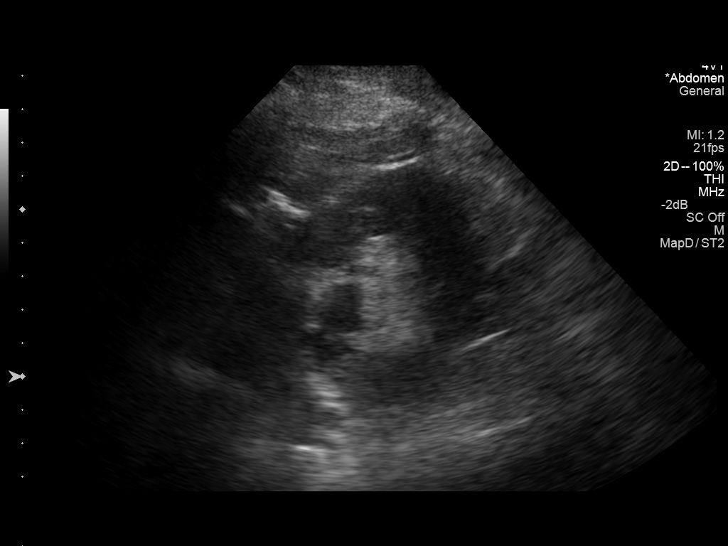
[im 36/40]
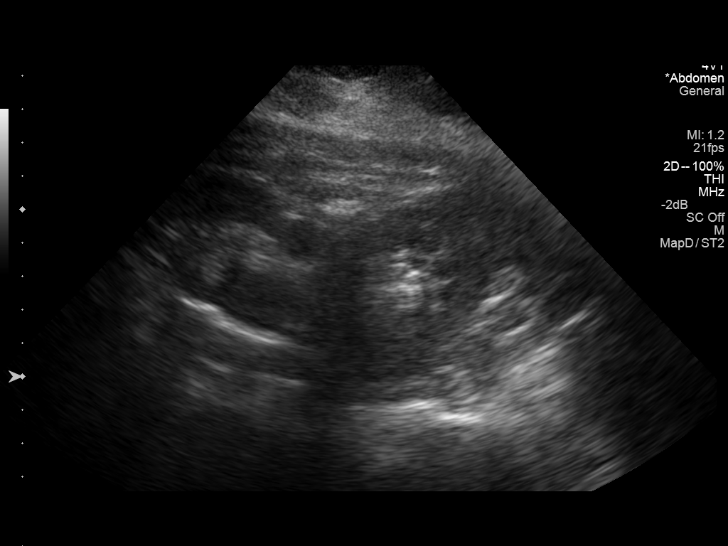
[im 40/40]
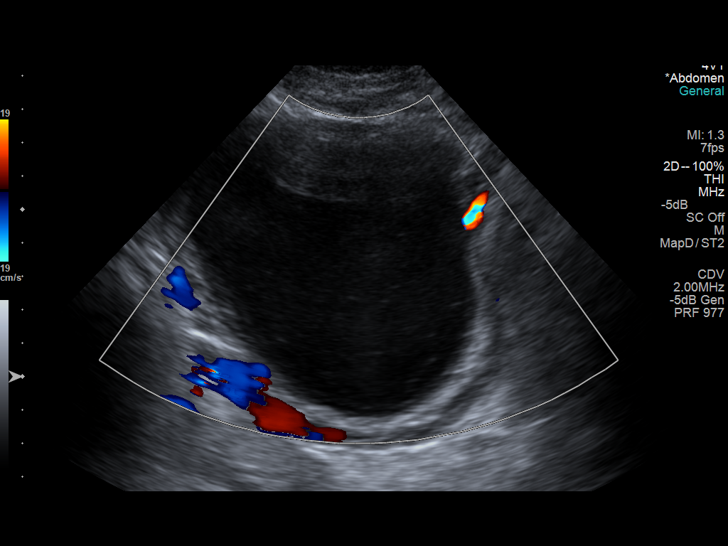

[14 of 25 positions shown; findings below may reference images not displayed]

FINDINGS: Right Kidney:

Length: 13.5 cm.. Echogenicity within normal limits. No mass or
hydronephrosis visualized.

Left Kidney:

Length: 14.7 cm..  Mild hydronephrosis is noted.

Bladder:

Appears normal for degree of bladder distention.
IMPRESSION: Mild left hydronephrosis.

## 2024-01-27 ENCOUNTER — Encounter (INDEPENDENT_AMBULATORY_CARE_PROVIDER_SITE_OTHER): Payer: Self-pay | Admitting: *Deleted
